# Patient Record
Sex: Female | Born: 1935 | Race: White | Hispanic: No | Smoking: Current every day smoker
Health system: Southern US, Community
[De-identification: ages and names within clinical notes are randomized; demographics above are authoritative.]

## PROBLEM LIST (undated history)

## (undated) DIAGNOSIS — M8580 Other specified disorders of bone density and structure, unspecified site: Secondary | ICD-10-CM

## (undated) DIAGNOSIS — I10 Essential (primary) hypertension: Secondary | ICD-10-CM

## (undated) DIAGNOSIS — I779 Disorder of arteries and arterioles, unspecified: Secondary | ICD-10-CM

## (undated) DIAGNOSIS — E785 Hyperlipidemia, unspecified: Secondary | ICD-10-CM

## (undated) DIAGNOSIS — C801 Malignant (primary) neoplasm, unspecified: Secondary | ICD-10-CM

## (undated) DIAGNOSIS — I739 Peripheral vascular disease, unspecified: Secondary | ICD-10-CM

---

## 2004-10-17 ENCOUNTER — Ambulatory Visit: Payer: Self-pay | Admitting: Family Medicine

## 2004-11-03 ENCOUNTER — Ambulatory Visit: Payer: Self-pay | Admitting: Unknown Physician Specialty

## 2005-05-16 ENCOUNTER — Ambulatory Visit: Payer: Self-pay | Admitting: Unknown Physician Specialty

## 2005-11-27 ENCOUNTER — Ambulatory Visit: Payer: Self-pay | Admitting: Family Medicine

## 2007-03-27 ENCOUNTER — Ambulatory Visit: Payer: Self-pay | Admitting: Family Medicine

## 2007-04-01 ENCOUNTER — Ambulatory Visit: Payer: Self-pay | Admitting: Family Medicine

## 2007-08-26 ENCOUNTER — Ambulatory Visit: Payer: Self-pay | Admitting: Family Medicine

## 2008-04-05 ENCOUNTER — Ambulatory Visit: Payer: Self-pay | Admitting: Family Medicine

## 2009-05-19 ENCOUNTER — Ambulatory Visit: Payer: Self-pay | Admitting: Family Medicine

## 2009-05-31 ENCOUNTER — Ambulatory Visit: Payer: Self-pay | Admitting: Family Medicine

## 2009-09-21 ENCOUNTER — Emergency Department: Payer: Self-pay | Admitting: Emergency Medicine

## 2009-09-28 ENCOUNTER — Ambulatory Visit: Payer: Self-pay | Admitting: Orthopedic Surgery

## 2009-09-29 ENCOUNTER — Ambulatory Visit: Payer: Self-pay | Admitting: Orthopedic Surgery

## 2009-11-29 ENCOUNTER — Ambulatory Visit: Payer: Self-pay | Admitting: Family Medicine

## 2010-07-19 ENCOUNTER — Ambulatory Visit: Payer: Self-pay | Admitting: Family Medicine

## 2010-11-01 ENCOUNTER — Ambulatory Visit: Payer: Self-pay | Admitting: Anesthesiology

## 2010-12-01 ENCOUNTER — Ambulatory Visit: Payer: Self-pay | Admitting: Anesthesiology

## 2010-12-04 ENCOUNTER — Ambulatory Visit: Payer: Self-pay | Admitting: Family Medicine

## 2011-01-30 ENCOUNTER — Ambulatory Visit: Payer: Self-pay | Admitting: Anesthesiology

## 2011-04-17 ENCOUNTER — Ambulatory Visit: Payer: Self-pay | Admitting: Anesthesiology

## 2011-05-28 ENCOUNTER — Ambulatory Visit: Payer: Self-pay | Admitting: Anesthesiology

## 2011-06-21 ENCOUNTER — Ambulatory Visit: Payer: Self-pay | Admitting: Pain Medicine

## 2011-06-28 ENCOUNTER — Ambulatory Visit: Payer: Self-pay | Admitting: Pain Medicine

## 2011-07-16 ENCOUNTER — Ambulatory Visit: Payer: Self-pay | Admitting: Pain Medicine

## 2011-12-31 ENCOUNTER — Ambulatory Visit: Payer: Self-pay | Admitting: Family Medicine

## 2012-01-31 ENCOUNTER — Ambulatory Visit: Payer: Self-pay | Admitting: Pain Medicine

## 2012-02-19 ENCOUNTER — Ambulatory Visit: Payer: Self-pay | Admitting: Pain Medicine

## 2012-12-31 ENCOUNTER — Ambulatory Visit: Payer: Self-pay | Admitting: Family Medicine

## 2014-01-01 ENCOUNTER — Ambulatory Visit: Payer: Self-pay | Admitting: Family Medicine

## 2014-01-16 ENCOUNTER — Emergency Department: Payer: Self-pay | Admitting: Emergency Medicine

## 2014-01-16 LAB — COMPREHENSIVE METABOLIC PANEL
ALBUMIN: 3.2 g/dL — AB (ref 3.4–5.0)
ANION GAP: 6 — AB (ref 7–16)
Alkaline Phosphatase: 52 U/L
BILIRUBIN TOTAL: 0.3 mg/dL (ref 0.2–1.0)
BUN: 21 mg/dL — ABNORMAL HIGH (ref 7–18)
CREATININE: 1.04 mg/dL (ref 0.60–1.30)
Calcium, Total: 8.5 mg/dL (ref 8.5–10.1)
Chloride: 104 mmol/L (ref 98–107)
Co2: 27 mmol/L (ref 21–32)
EGFR (African American): 57 — ABNORMAL LOW
EGFR (Non-African Amer.): 49 — ABNORMAL LOW
Glucose: 89 mg/dL (ref 65–99)
OSMOLALITY: 276 (ref 275–301)
Potassium: 4.4 mmol/L (ref 3.5–5.1)
SGOT(AST): 35 U/L (ref 15–37)
SGPT (ALT): 15 U/L (ref 12–78)
Sodium: 137 mmol/L (ref 136–145)
TOTAL PROTEIN: 6.5 g/dL (ref 6.4–8.2)

## 2014-01-16 LAB — URINALYSIS, COMPLETE
Bacteria: NONE SEEN
Bilirubin,UR: NEGATIVE
Blood: NEGATIVE
Glucose,UR: NEGATIVE mg/dL (ref 0–75)
Hyaline Cast: 12
Ketone: NEGATIVE
Nitrite: NEGATIVE
PH: 5 (ref 4.5–8.0)
Protein: NEGATIVE
RBC,UR: 2 /HPF (ref 0–5)
SPECIFIC GRAVITY: 1.02 (ref 1.003–1.030)
Squamous Epithelial: 2
WBC UR: 17 /HPF (ref 0–5)

## 2014-01-16 LAB — CBC
HCT: 36.1 % (ref 35.0–47.0)
HGB: 12.1 g/dL (ref 12.0–16.0)
MCH: 32.5 pg (ref 26.0–34.0)
MCHC: 33.6 g/dL (ref 32.0–36.0)
MCV: 97 fL (ref 80–100)
Platelet: 252 10*3/uL (ref 150–440)
RBC: 3.72 10*6/uL — AB (ref 3.80–5.20)
RDW: 15.1 % — AB (ref 11.5–14.5)
WBC: 7.7 10*3/uL (ref 3.6–11.0)

## 2014-01-16 LAB — TROPONIN I
Troponin-I: <0.02 ng/mL
Troponin-I: <0.02 ng/mL

## 2015-01-03 ENCOUNTER — Ambulatory Visit
Admit: 2015-01-03 | Disposition: A | Discharge status: Home / Self Care | Payer: Self-pay | Attending: Family Medicine | Admitting: Family Medicine

## 2015-01-03 ENCOUNTER — Ambulatory Visit
Admit: 2015-01-03 | Disposition: A | Discharge status: Home / Self Care | Payer: Self-pay | Attending: Vascular Surgery | Admitting: Vascular Surgery

## 2015-03-09 ENCOUNTER — Encounter: Payer: Self-pay | Admitting: *Deleted

## 2015-03-09 ENCOUNTER — Inpatient Hospital Stay
Admission: RE | Admit: 2015-03-09 | Discharge: 2015-03-10 | DRG: 036 | Disposition: A | Discharge status: Home / Self Care | Payer: Medicare Other | Source: Ambulatory Visit | Attending: Vascular Surgery | Admitting: Vascular Surgery

## 2015-03-09 ENCOUNTER — Encounter
Admission: RE | Disposition: A | Discharge status: Home / Self Care | Payer: Self-pay | Source: Ambulatory Visit | Attending: Vascular Surgery

## 2015-03-09 DIAGNOSIS — I25119 Atherosclerotic heart disease of native coronary artery with unspecified angina pectoris: Secondary | ICD-10-CM | POA: Diagnosis present

## 2015-03-09 DIAGNOSIS — Z9842 Cataract extraction status, left eye: Secondary | ICD-10-CM

## 2015-03-09 DIAGNOSIS — F17200 Nicotine dependence, unspecified, uncomplicated: Secondary | ICD-10-CM | POA: Diagnosis present

## 2015-03-09 DIAGNOSIS — Z823 Family history of stroke: Secondary | ICD-10-CM

## 2015-03-09 DIAGNOSIS — Z8249 Family history of ischemic heart disease and other diseases of the circulatory system: Secondary | ICD-10-CM

## 2015-03-09 DIAGNOSIS — Z8542 Personal history of malignant neoplasm of other parts of uterus: Secondary | ICD-10-CM

## 2015-03-09 DIAGNOSIS — Z9841 Cataract extraction status, right eye: Secondary | ICD-10-CM | POA: Diagnosis not present

## 2015-03-09 DIAGNOSIS — M8580 Other specified disorders of bone density and structure, unspecified site: Secondary | ICD-10-CM | POA: Diagnosis present

## 2015-03-09 DIAGNOSIS — Z7982 Long term (current) use of aspirin: Secondary | ICD-10-CM | POA: Diagnosis not present

## 2015-03-09 DIAGNOSIS — I10 Essential (primary) hypertension: Secondary | ICD-10-CM | POA: Diagnosis present

## 2015-03-09 DIAGNOSIS — I6522 Occlusion and stenosis of left carotid artery: Secondary | ICD-10-CM | POA: Diagnosis present

## 2015-03-09 DIAGNOSIS — I6529 Occlusion and stenosis of unspecified carotid artery: Secondary | ICD-10-CM | POA: Diagnosis present

## 2015-03-09 DIAGNOSIS — Z9889 Other specified postprocedural states: Secondary | ICD-10-CM

## 2015-03-09 DIAGNOSIS — E785 Hyperlipidemia, unspecified: Secondary | ICD-10-CM | POA: Diagnosis present

## 2015-03-09 DIAGNOSIS — I739 Peripheral vascular disease, unspecified: Secondary | ICD-10-CM | POA: Diagnosis present

## 2015-03-09 DIAGNOSIS — Z8673 Personal history of transient ischemic attack (TIA), and cerebral infarction without residual deficits: Secondary | ICD-10-CM

## 2015-03-09 HISTORY — PX: PERIPHERAL VASCULAR CATHETERIZATION: SHX172C

## 2015-03-09 HISTORY — DX: Malignant (primary) neoplasm, unspecified: C80.1

## 2015-03-09 HISTORY — DX: Essential (primary) hypertension: I10

## 2015-03-09 HISTORY — DX: Peripheral vascular disease, unspecified: I73.9

## 2015-03-09 HISTORY — DX: Other specified disorders of bone density and structure, unspecified site: M85.80

## 2015-03-09 HISTORY — DX: Disorder of arteries and arterioles, unspecified: I77.9

## 2015-03-09 HISTORY — DX: Hyperlipidemia, unspecified: E78.5

## 2015-03-09 LAB — CREATININE, SERUM
Creatinine, Ser: 1.03 mg/dL — ABNORMAL HIGH (ref 0.44–1.00)
GFR calc non Af Amer: 48 mL/min — ABNORMAL LOW (ref >60)
GFR, EST AFRICAN AMERICAN: 55 mL/min — AB (ref >60)

## 2015-03-09 LAB — BUN: BUN: 14 mg/dL (ref 6–20)

## 2015-03-09 SURGERY — CAROTID PTA/STENT INTERVENTION
Anesthesia: Moderate Sedation | Laterality: Left

## 2015-03-09 MED ORDER — MORPHINE SULFATE 4 MG/ML IJ SOLN: 2.0000 mg | INTRAMUSCULAR | Status: DC | PRN

## 2015-03-09 MED ORDER — GUAIFENESIN-DM 100-10 MG/5ML PO SYRP: 15.0000 mL | ORAL_SOLUTION | ORAL | Status: DC | PRN

## 2015-03-09 MED ORDER — MIDAZOLAM HCL 5 MG/5ML IJ SOLN
INTRAMUSCULAR | Status: AC
  Filled 2015-03-09: qty 5

## 2015-03-09 MED ORDER — ACETAMINOPHEN 325 MG PO TABS
325.0000 mg | ORAL_TABLET | ORAL | Status: DC | PRN
Start: 2015-03-09 — End: 2015-03-10

## 2015-03-09 MED ORDER — MIDAZOLAM HCL 2 MG/2ML IJ SOLN
INTRAMUSCULAR | Status: DC | PRN
  Administered 2015-03-09: 2 mg via INTRAVENOUS
  Administered 2015-03-09 (×2): 0.5 mg via INTRAVENOUS
  Administered 2015-03-09: 1 mg via INTRAVENOUS

## 2015-03-09 MED ORDER — PHENYLEPHRINE HCL 10 MG/ML IJ SOLN
INTRAMUSCULAR | Status: AC
  Filled 2015-03-09: qty 1

## 2015-03-09 MED ORDER — ACETAMINOPHEN 325 MG RE SUPP
325.0000 mg | RECTAL | Status: DC | PRN
Start: 2015-03-09 — End: 2015-03-10

## 2015-03-09 MED ORDER — SODIUM CHLORIDE 0.9 % IV SOLN
INTRAVENOUS | Status: DC
  Administered 2015-03-09: 10:00:00 via INTRAVENOUS

## 2015-03-09 MED ORDER — POTASSIUM CHLORIDE CRYS ER 20 MEQ PO TBCR
20.0000 meq | EXTENDED_RELEASE_TABLET | Freq: Every day | ORAL | Status: DC | PRN
Start: 2015-03-09 — End: 2015-03-10

## 2015-03-09 MED ORDER — FAMOTIDINE IN NACL 20-0.9 MG/50ML-% IV SOLN
20.0000 mg | Freq: Two times a day (BID) | INTRAVENOUS | Status: DC
  Administered 2015-03-09 (×2): 20 mg via INTRAVENOUS
  Filled 2015-03-09 (×5): qty 50

## 2015-03-09 MED ORDER — LIDOCAINE HCL (PF) 1 % IJ SOLN
INTRAMUSCULAR | Status: AC
  Filled 2015-03-09: qty 10

## 2015-03-09 MED ORDER — HEPARIN SODIUM (PORCINE) 1000 UNIT/ML IJ SOLN
INTRAMUSCULAR | Status: AC
  Filled 2015-03-09: qty 1

## 2015-03-09 MED ORDER — ONDANSETRON HCL 4 MG/2ML IJ SOLN: 4.0000 mg | Freq: Four times a day (QID) | INTRAMUSCULAR | Status: DC | PRN

## 2015-03-09 MED ORDER — DOPAMINE-DEXTROSE 3.2-5 MG/ML-% IV SOLN: 3.0000 ug/kg/min | INTRAVENOUS | Status: DC

## 2015-03-09 MED ORDER — BACLOFEN 10 MG PO TABS
10.0000 mg | ORAL_TABLET | Freq: Three times a day (TID) | ORAL | Status: DC
  Administered 2015-03-09 – 2015-03-10 (×2): 10 mg via ORAL
  Filled 2015-03-09 (×2): qty 1

## 2015-03-09 MED ORDER — DEXTROSE 5 % IV SOLN
1.5000 g | Freq: Two times a day (BID) | INTRAVENOUS | Status: AC
  Administered 2015-03-09 – 2015-03-10 (×2): 1.5 g via INTRAVENOUS
  Filled 2015-03-09 (×2): qty 1.5

## 2015-03-09 MED ORDER — LABETALOL HCL 5 MG/ML IV SOLN: 10.0000 mg | INTRAVENOUS | Status: DC | PRN

## 2015-03-09 MED ORDER — ASPIRIN EC 81 MG PO TBEC
81.0000 mg | DELAYED_RELEASE_TABLET | Freq: Every day | ORAL | Status: DC
  Administered 2015-03-09 – 2015-03-10 (×2): 81 mg via ORAL
  Filled 2015-03-09: qty 1

## 2015-03-09 MED ORDER — HEPARIN SODIUM (PORCINE) 1000 UNIT/ML IJ SOLN
INTRAMUSCULAR | Status: AC
Start: 2015-03-09 — End: 2015-03-09
  Filled 2015-03-09: qty 1

## 2015-03-09 MED ORDER — ASPIRIN 81 MG PO CHEW
CHEWABLE_TABLET | ORAL | Status: AC
  Filled 2015-03-09: qty 1

## 2015-03-09 MED ORDER — CEFAZOLIN SODIUM 1-5 GM-% IV SOLN
INTRAVENOUS | Status: AC
  Filled 2015-03-09: qty 50

## 2015-03-09 MED ORDER — FENTANYL CITRATE (PF) 100 MCG/2ML IJ SOLN
INTRAMUSCULAR | Status: AC
  Filled 2015-03-09: qty 2

## 2015-03-09 MED ORDER — PHENOL 1.4 % MT LIQD: 1.0000 | OROMUCOSAL | Status: DC | PRN

## 2015-03-09 MED ORDER — OXYCODONE-ACETAMINOPHEN 5-325 MG PO TABS: 1.0000 | ORAL_TABLET | ORAL | Status: DC | PRN

## 2015-03-09 MED ORDER — ASPIRIN EC 81 MG PO TBEC: 81.0000 mg | DELAYED_RELEASE_TABLET | Freq: Every day | ORAL | Status: DC

## 2015-03-09 MED ORDER — SODIUM CHLORIDE 0.9 % IV SOLN
INTRAVENOUS | Status: DC
  Administered 2015-03-09: 12:00:00 via INTRAVENOUS

## 2015-03-09 MED ORDER — MAGNESIUM SULFATE 2 GM/50ML IV SOLN: 2.0000 g | Freq: Every day | INTRAVENOUS | Status: DC | PRN

## 2015-03-09 MED ORDER — METOPROLOL TARTRATE 1 MG/ML IV SOLN
2.0000 mg | INTRAVENOUS | Status: DC | PRN
Start: 2015-03-09 — End: 2015-03-10

## 2015-03-09 MED ORDER — CLOPIDOGREL BISULFATE 75 MG PO TABS
75.0000 mg | ORAL_TABLET | Freq: Every day | ORAL | Status: DC
  Administered 2015-03-09 – 2015-03-10 (×2): 75 mg via ORAL
  Filled 2015-03-09: qty 1

## 2015-03-09 MED ORDER — ALUM & MAG HYDROXIDE-SIMETH 200-200-20 MG/5ML PO SUSP: 15.0000 mL | ORAL | Status: DC | PRN

## 2015-03-09 MED ORDER — ATORVASTATIN CALCIUM 10 MG PO TABS
10.0000 mg | ORAL_TABLET | Freq: Every day | ORAL | Status: DC
  Administered 2015-03-10: 10 mg via ORAL
  Filled 2015-03-09: qty 1

## 2015-03-09 MED ORDER — SODIUM CHLORIDE 0.9 % IV SOLN: 500.0000 mL | Freq: Once | INTRAVENOUS | Status: AC | PRN

## 2015-03-09 MED ORDER — ATROPINE SULFATE 0.1 MG/ML IJ SOLN
INTRAMUSCULAR | Status: AC
  Filled 2015-03-09: qty 10

## 2015-03-09 MED ORDER — CLOPIDOGREL BISULFATE 75 MG PO TABS
ORAL_TABLET | ORAL | Status: AC
  Filled 2015-03-09: qty 1

## 2015-03-09 MED ORDER — SODIUM CHLORIDE 0.9 % IJ SOLN
INTRAMUSCULAR | Status: AC
  Filled 2015-03-09: qty 15

## 2015-03-09 MED ORDER — CEFAZOLIN SODIUM 1-5 GM-% IV SOLN
1.0000 g | Freq: Once | INTRAVENOUS | Status: AC
  Administered 2015-03-09: 1 g via INTRAVENOUS

## 2015-03-09 MED ORDER — CALCIUM GLUCONATE 500 MG PO TABS
1.0000 | ORAL_TABLET | Freq: Three times a day (TID) | ORAL | Status: DC
  Administered 2015-03-10: 500 mg via ORAL
  Filled 2015-03-09: qty 1

## 2015-03-09 MED ORDER — DOCUSATE SODIUM 100 MG PO CAPS
100.0000 mg | ORAL_CAPSULE | Freq: Two times a day (BID) | ORAL | Status: DC
  Administered 2015-03-09: 100 mg via ORAL
  Filled 2015-03-09: qty 1

## 2015-03-09 MED ORDER — VITAMIN D3 25 MCG (1000 UNIT) PO TABS
5000.0000 [IU] | ORAL_TABLET | Freq: Every day | ORAL | Status: DC
  Administered 2015-03-10: 5000 [IU] via ORAL
  Filled 2015-03-09: qty 5

## 2015-03-09 MED ORDER — HEPARIN (PORCINE) IN NACL 2-0.9 UNIT/ML-% IJ SOLN
INTRAMUSCULAR | Status: AC
  Filled 2015-03-09: qty 1000

## 2015-03-09 MED ORDER — FENTANYL CITRATE (PF) 100 MCG/2ML IJ SOLN
INTRAMUSCULAR | Status: DC | PRN
  Administered 2015-03-09: 25 ug via INTRAVENOUS
  Administered 2015-03-09: 50 ug via INTRAVENOUS
  Administered 2015-03-09: 25 ug via INTRAVENOUS
  Administered 2015-03-09: 50 ug via INTRAVENOUS

## 2015-03-09 MED ORDER — HEPARIN SODIUM (PORCINE) 1000 UNIT/ML IJ SOLN
INTRAMUSCULAR | Status: DC | PRN
  Administered 2015-03-09: 5000 [IU] via INTRAVENOUS

## 2015-03-09 SURGICAL SUPPLY — 19 items
BALLN VIATRAC 5X20X135 (BALLOONS) ×4
BALLOON VIATRAC 5X20X135 (BALLOONS) ×2 IMPLANT
CARTRIDGE ACTIVE CLOT (MISCELLANEOUS) ×4 IMPLANT
CATH BEACON 5 .035 100 JB2 TIP (CATHETERS) ×4 IMPLANT
CATH PIG 5.0X110 10S (CATHETERS) ×4 IMPLANT
DEVICE EMBOSHIELD NAV6 4.0-7.0 (WIRE) ×4 IMPLANT
DEVICE STARCLOSE SE CLOSURE (Vascular Products) ×4 IMPLANT
GLIDEWIRE ANGLED SS 035X260CM (WIRE) ×4 IMPLANT
GUIDEWIRE ANGLED .035X260CM (WIRE) ×4 IMPLANT
KIT CAROTID MANIFOLD (MISCELLANEOUS) ×4 IMPLANT
PACK ANGIOGRAPHY (CUSTOM PROCEDURE TRAY) ×4 IMPLANT
SET INTRO CAPELLA COAXIAL (SET/KITS/TRAYS/PACK) ×4 IMPLANT
SHEATH BRITE TIP 5FRX11 (SHEATH) ×4 IMPLANT
SHEATH SHUTTLE SELECT 6F (SHEATH) ×4 IMPLANT
STENT XACT CAR 8X30X136 (Permanent Stent) ×4 IMPLANT
SYR MEDRAD MARK V 150ML (SYRINGE) ×4 IMPLANT
TUBING CONTRAST HIGH PRESS 72 (TUBING) ×4 IMPLANT
WIRE AMPLATZ SSTIFF .035X260CM (WIRE) ×4 IMPLANT
WIRE J 3MM .035X145CM (WIRE) ×4 IMPLANT

## 2015-03-09 NOTE — Progress Notes (Signed)
S:  Alert no c/o  O:  VSS neuro intact groin fine  A:  S/p left carotid stent placement  P:  Normal course thus far no changes

## 2015-03-09 NOTE — Progress Notes (Signed)
Pt doing well post carotid stent placement, bil. Grips strong,equal, alert and orientedx4, taking po's without difficulty, vitals have remained stable with no present need to start dopamine gtt, Dr Delana Meyer in to see pt. With questions answered to pt and family

## 2015-03-09 NOTE — Op Note (Signed)
OPERATIVE NOTE   PROCEDURE: 1.  ultrasound guidance for vascular access right femoral artery 2.  Placement of a 8 x 30 Exact stent with the use of the NAV-6 embolic protection device  PRE-OPERATIVE DIAGNOSIS: 1. Symptomatic carotid artery stenosis. 2. Recent stroke 3. Coronary artery disease with exertional angina  POST-OPERATIVE DIAGNOSIS:  Same as above  SURGEON: Hortencia Pilar, M.D.  ASSISTANT(S):  Leotis Pain, MD  ANESTHESIA: local/MCS  ESTIMATED BLOOD LOSS:  100 cc  FINDING(S): 1.   90% left internal carotid artery stenosis  SPECIMEN(S):   none  INDICATIONS:   Patient is a 81 year old woman who presents with  symptomatic left internal carotid artery stenosis.  During her cardiac evaluation she had ischemic changes however cardiology felt she was high risk for coronary intervention and not a candidate for surgery and therefore recommended proceeding with treatment of her carotid lesion using stenting rather than surgery  DESCRIPTION: After obtaining full informed written consent, the patient was brought back to the operating room and placed supine upon the operating table.  The patient received IV antibiotics prior to induction.  After obtaining adequate anesthesia, the patient was prepped and draped in the standard fashion for.   The right femoral artery was visualized with ultrasound and found to be widely patent. It was then accessed under direct ultrasound guidance without difficulty with a Seldinger needle. A permanent image was recorded. A J-wire was placed and we then placed a 6 French sheath. The patient was then heparinized and a total of 5000 units of intravenous heparin were given. A pigtail catheter was then placed into the ascending aorta. This showed a type III arch. I then selectively cannulated the left common carotid without difficulty with a JB 1 catheter and advanced into the mid portion of the common carotid artery.  Cervical and cerebral carotid angiography  was then performed. There were no obvious intracranial filling defects with hand injection of contrast. Of note on the water's view there was very poor filling of the anterior cerebral artery The carotid bifurcation demonstrated severe lesion proximally 1 cm distal to the internal carotid artery origin measuring approximately 90% diameter reduction.  I then advanced into the external carotid artery with a Glidewire and the JB 1 catheter and then exchanged for the Amplatz Super Stiff wire. Over the Amplatz Super Stiff wire, a 6 [place] shuttle sheath was placed into the mid common carotid artery. I then used the large NAV-6  Embolic protection device and crossed the lesion and parked this in the distal internal carotid artery at the base of the skull. Attempts at advancing the stent cause the sheath to retract to the mid-level of the common carotid and therefore the stent was removed and the 5 x 2 balloon was advanced over the wire positioned across the lesion. Predilatation was performed to 6 atm for a brief time period. The sheath was then advanced just short of the lesion and the stent reintroduced without difficulty. I then selected an 8 x 30 straight Exact stent. This was deployed across the lesion encompassing it in its entirety. A 5 x 2 balloon was used to post dilate the stent. Only about a 20 % residual stenosis was present after angioplasty. Completion angiogram showed normal intracranial filling without new defects and a dramatic improvement in anterior cerebral perfusion. At this point I elected to terminate the procedure. The sheath was removed and StarClose closure device was deployed in the left femoral artery with excellent hemostatic result. The patient  was taken to the recovery room in stable condition having tolerated the procedure well.  COMPLICATIONS: none  CONDITION: stable  [name] 03/09/2015 11:41 AM

## 2015-03-10 ENCOUNTER — Encounter: Payer: Self-pay | Admitting: Vascular Surgery

## 2015-03-10 LAB — CBC
HCT: 34.8 % — ABNORMAL LOW (ref 35.0–47.0)
Hemoglobin: 11.7 g/dL — ABNORMAL LOW (ref 12.0–16.0)
MCH: 32.4 pg (ref 26.0–34.0)
MCHC: 33.7 g/dL (ref 32.0–36.0)
MCV: 96 fL (ref 80.0–100.0)
PLATELETS: 158 10*3/uL (ref 150–440)
RBC: 3.62 MIL/uL — AB (ref 3.80–5.20)
RDW: 14.6 % — AB (ref 11.5–14.5)
WBC: 6.2 10*3/uL (ref 3.6–11.0)

## 2015-03-10 LAB — BASIC METABOLIC PANEL
Anion gap: 4 — ABNORMAL LOW (ref 5–15)
BUN: 9 mg/dL (ref 6–20)
CO2: 27 mmol/L (ref 22–32)
Calcium: 8.1 mg/dL — ABNORMAL LOW (ref 8.9–10.3)
Chloride: 111 mmol/L (ref 101–111)
Creatinine, Ser: 0.94 mg/dL (ref 0.44–1.00)
GFR calc Af Amer: >60 mL/min (ref >60)
GFR calc non Af Amer: 53 mL/min — ABNORMAL LOW (ref >60)
Glucose, Bld: 91 mg/dL (ref 65–99)
Potassium: 4.1 mmol/L (ref 3.5–5.1)
Sodium: 142 mmol/L (ref 135–145)

## 2015-03-10 MED ORDER — CLOPIDOGREL BISULFATE 75 MG PO TABS: 75.0000 mg | ORAL_TABLET | Freq: Every day | ORAL | Status: DC

## 2015-03-10 NOTE — Progress Notes (Signed)
IVs removed, cathether intact.  Patient's belongings, discharge instructions, and prescription given to patient.  Patient and family with no further questions.   Patient discharged via wheel chair.

## 2015-03-10 NOTE — Progress Notes (Signed)
Patient doing well with no complaints.  Right groin site wdl, pulses at baseline.  MD in room to see patient this am.  Sitting in bed eating breakfast.  Patient waiting on discharge order and paperwork.

## 2015-03-11 NOTE — H&P (Signed)
 [facility] History & Physical Update  The patient was interviewed and re-examined.  The patient's previous History and Physical has been reviewed and is unchanged.  There is no change in the plan of care. We plan to proceed with the scheduled procedure.  Zac Torti, Dolores Lory, MD  03/11/2015, 9:11 AM

## 2015-03-15 ENCOUNTER — Encounter: Payer: Self-pay | Admitting: Vascular Surgery

## 2015-03-23 NOTE — Discharge Summary (Signed)
[facility]    Discharge Summary    Patient ID:  Jamie Spence MRN: [id number] DOB/AGE: Jun 18, 1926 81 y.o.  Admit date: 03/09/2015 Discharge date: 03/23/2015 Date of Surgery: 03/09/2015 Surgeon: Surgeon(s): [name], MD  Admission Diagnosis: Carotid Stenosis    ABBOTT REP NEEDED  Discharge Diagnoses:  Carotid Stenosis    ABBOTT REP NEEDED  Secondary Diagnoses: Past Medical History  Diagnosis Date  . Hypertension   . Peripheral vascular disease   . Carotid artery disease   . Osteopenia   . Hyperlipemia   . Cancer     uterine    Procedure(s): Carotid PTA/Stent Intervention Carotid Angiography  Discharged Condition: good  HPI:  Patient with critical stenosis and multiple comorbidities and therefore is undergoing carotid stenting  Hospital Course:  Jamie Spence is a 81 y.o. female is S/P Left Procedure(s): Carotid PTA/Stent Intervention Carotid Angiography Extubated: POD # 0 Physical exam: Patient remains neurologically intact no hematoma of the groin Post-op wounds clean, dry, intact or healing well Pt. Ambulating, voiding and taking PO diet without difficulty. Pt pain controlled with PO pain meds. Labs as below Complications:none  Consults:     Significant Diagnostic Studies: CBC Lab Results  Component Value Date   WBC 6.2 03/10/2015   HGB 11.7* 03/10/2015   HCT 34.8* 03/10/2015   MCV 96.0 03/10/2015   PLT 158 03/10/2015    BMET    Component Value Date/Time   NA 142 03/10/2015 0558   NA 137 01/16/2014 1704   K 4.1 03/10/2015 0558   K 4.4 01/16/2014 1704   CL 111 03/10/2015 0558   CL 104 01/16/2014 1704   CO2 27 03/10/2015 0558   CO2 27 01/16/2014 1704   GLUCOSE 91 03/10/2015 0558   GLUCOSE 89 01/16/2014 1704   BUN 9 03/10/2015 0558   BUN 21* 01/16/2014 1704   CREATININE 0.94 03/10/2015 0558   CREATININE 1.04 01/16/2014 1704   CALCIUM 8.1* 03/10/2015 0558   CALCIUM 8.5 01/16/2014 1704    GFRNONAA 53* 03/10/2015 0558   GFRNONAA 49* 01/16/2014 1704   GFRAA >60 03/10/2015 0558   GFRAA 57* 01/16/2014 1704   COAG No results found for: INR, PROTIME   Disposition:  Discharge to :Home Discharge Instructions    Call MD for:  redness, tenderness, or signs of infection (pain, swelling, bleeding, redness, odor or green/yellow discharge around incision site)    Complete by:  As directed      Call MD for:  severe or increased pain, loss or decreased feeling  in affected limb(s)    Complete by:  As directed      Call MD for:  temperature >100.5    Complete by:  As directed      Resume previous diet    Complete by:  As directed             Medication List    TAKE these medications        aspirin EC 81 MG tablet  Take 81 mg by mouth daily.     atorvastatin 10 MG tablet  Commonly known as:  LIPITOR  Take 10 mg by mouth daily.     baclofen 10 MG tablet  Commonly known as:  LIORESAL  Take 10 mg by mouth 3 (three) times daily.     calcium gluconate 500 MG tablet  Take 1 tablet by mouth 3 (three) times daily.     cholecalciferol 1000 UNITS tablet  Commonly known as:  VITAMIN D  Take 5,000 Units by mouth daily.     clopidogrel 75 MG tablet  Commonly known as:  PLAVIX  Take 1 tablet (75 mg total) by mouth daily.     docusate sodium 100 MG capsule  Commonly known as:  COLACE  Take 100 mg by mouth 2 (two) times daily.     ferrous sulfate 75 (15 FE) MG/ML Soln  Commonly known as:  FER-IN-SOL  Take 65 mg by mouth.     magnesium oxide 400 MG tablet  Commonly known as:  MAG-OX  Take 400 mg by mouth daily.       Verbal and written Discharge instructions given to the patient. Wound care per Discharge AVS     Follow-up Information    Follow up with Paxton Binns, Dolores Lory, MD In 2 weeks.   Specialties:  Vascular Surgery, Cardiology, Radiology, Vascular Surgery   Why:  follow up after procedure   Contact information:   [place]  [id number] [phone]       Signed: [name], MD  03/23/2015, 10:26 AM

## 2015-05-22 ENCOUNTER — Emergency Department: Payer: Medicare Other

## 2015-05-22 ENCOUNTER — Emergency Department
Admission: EM | Admit: 2015-05-22 | Discharge: 2015-05-22 | Disposition: A | Discharge status: Home / Self Care | Payer: Medicare Other | Attending: Emergency Medicine | Admitting: Emergency Medicine

## 2015-05-22 ENCOUNTER — Encounter: Payer: Self-pay | Admitting: Adult Health

## 2015-05-22 DIAGNOSIS — Z7982 Long term (current) use of aspirin: Secondary | ICD-10-CM | POA: Insufficient documentation

## 2015-05-22 DIAGNOSIS — Z23 Encounter for immunization: Secondary | ICD-10-CM | POA: Diagnosis not present

## 2015-05-22 DIAGNOSIS — Z7902 Long term (current) use of antithrombotics/antiplatelets: Secondary | ICD-10-CM | POA: Diagnosis not present

## 2015-05-22 DIAGNOSIS — N390 Urinary tract infection, site not specified: Secondary | ICD-10-CM

## 2015-05-22 DIAGNOSIS — Z720 Tobacco use: Secondary | ICD-10-CM | POA: Insufficient documentation

## 2015-05-22 DIAGNOSIS — Z79899 Other long term (current) drug therapy: Secondary | ICD-10-CM | POA: Insufficient documentation

## 2015-05-22 DIAGNOSIS — I10 Essential (primary) hypertension: Secondary | ICD-10-CM | POA: Diagnosis not present

## 2015-05-22 DIAGNOSIS — R55 Syncope and collapse: Secondary | ICD-10-CM | POA: Diagnosis not present

## 2015-05-22 LAB — CBC
HEMATOCRIT: 39.6 % (ref 35.0–47.0)
HEMOGLOBIN: 13.4 g/dL (ref 12.0–16.0)
MCH: 33.5 pg (ref 26.0–34.0)
MCHC: 33.8 g/dL (ref 32.0–36.0)
MCV: 99.1 fL (ref 80.0–100.0)
Platelets: 215 10*3/uL (ref 150–440)
RBC: 4 MIL/uL (ref 3.80–5.20)
RDW: 14.2 % (ref 11.5–14.5)
WBC: 8.8 10*3/uL (ref 3.6–11.0)

## 2015-05-22 LAB — URINALYSIS COMPLETE WITH MICROSCOPIC (ARMC ONLY)
Bilirubin Urine: NEGATIVE
Glucose, UA: NEGATIVE mg/dL
Hgb urine dipstick: NEGATIVE
Ketones, ur: NEGATIVE mg/dL
Nitrite: NEGATIVE
PH: 7 (ref 5.0–8.0)
PROTEIN: NEGATIVE mg/dL
Specific Gravity, Urine: 1.008 (ref 1.005–1.030)

## 2015-05-22 LAB — TROPONIN I

## 2015-05-22 LAB — BASIC METABOLIC PANEL
Anion gap: 7 (ref 5–15)
BUN: 14 mg/dL (ref 6–20)
CHLORIDE: 102 mmol/L (ref 101–111)
CO2: 27 mmol/L (ref 22–32)
Calcium: 9.2 mg/dL (ref 8.9–10.3)
Creatinine, Ser: 1.11 mg/dL — ABNORMAL HIGH (ref 0.44–1.00)
GFR calc non Af Amer: 43 mL/min — ABNORMAL LOW (ref >60)
GFR, EST AFRICAN AMERICAN: 50 mL/min — AB (ref >60)
Glucose, Bld: 95 mg/dL (ref 65–99)
POTASSIUM: 4.3 mmol/L (ref 3.5–5.1)
SODIUM: 136 mmol/L (ref 135–145)

## 2015-05-22 MED ORDER — OXYCODONE-ACETAMINOPHEN 5-325 MG PO TABS
1.0000 | ORAL_TABLET | Freq: Once | ORAL | Status: AC
  Administered 2015-05-22: 1 via ORAL
  Filled 2015-05-22: qty 1

## 2015-05-22 MED ORDER — CEPHALEXIN 500 MG PO CAPS: 500.0000 mg | ORAL_CAPSULE | Freq: Three times a day (TID) | ORAL | Status: DC

## 2015-05-22 MED ORDER — TETANUS-DIPHTH-ACELL PERTUSSIS 5-2.5-18.5 LF-MCG/0.5 IM SUSP
0.5000 mL | Freq: Once | INTRAMUSCULAR | Status: AC
  Administered 2015-05-22: 0.5 mL via INTRAMUSCULAR
  Filled 2015-05-22: qty 0.5

## 2015-05-22 NOTE — Discharge Instructions (Signed)
Please follow up with your primary care doctor in the next 2-3 days. Please seek medical attention for any high fevers, chest pain, shortness of breath, change in behavior, persistent vomiting, bloody stool or any other new or concerning symptoms.   Syncope Syncope is a medical term for fainting or passing out. This means you lose consciousness and drop to the ground. People are generally unconscious for less than 5 minutes. You may have some muscle twitches for up to 15 seconds before waking up and returning to normal. Syncope occurs more often in older adults, but it can happen to anyone. While most causes of syncope are not dangerous, syncope can be a sign of a serious medical problem. It is important to seek medical care.  CAUSES  Syncope is caused by a sudden drop in blood flow to the brain. The specific cause is often not determined. Factors that can bring on syncope include:  Taking medicines that lower blood pressure.  Sudden changes in posture, such as standing up quickly.  Taking more medicine than prescribed.  Standing in one place for too long.  Seizure disorders.  Dehydration and excessive exposure to heat.  Low blood sugar (hypoglycemia).  Straining to have a bowel movement.  Heart disease, irregular heartbeat, or other circulatory problems.  Fear, emotional distress, seeing blood, or severe pain. SYMPTOMS  Right before fainting, you may:  Feel dizzy or light-headed.  Feel nauseous.  See all white or all black in your field of vision.  Have cold, clammy skin. DIAGNOSIS  Your health care provider will ask about your symptoms, perform a physical exam, and perform an electrocardiogram (ECG) to record the electrical activity of your heart. Your health care provider may also perform other heart or blood tests to determine the cause of your syncope which may include:  Transthoracic echocardiogram (TTE). During echocardiography, sound waves are used to evaluate how  blood flows through your heart.  Transesophageal echocardiogram (TEE).  Cardiac monitoring. This allows your health care provider to monitor your heart rate and rhythm in real time.  Holter monitor. This is a portable device that records your heartbeat and can help diagnose heart arrhythmias. It allows your health care provider to track your heart activity for several days, if needed.  Stress tests by exercise or by giving medicine that makes the heart beat faster. TREATMENT  In most cases, no treatment is needed. Depending on the cause of your syncope, your health care provider may recommend changing or stopping some of your medicines. HOME CARE INSTRUCTIONS  Have someone stay with you until you feel stable.  Do not drive, use machinery, or play sports until your health care provider says it is okay.  Keep all follow-up appointments as directed by your health care provider.  Lie down right away if you start feeling like you might faint. Breathe deeply and steadily. Wait until all the symptoms have passed.  Drink enough fluids to keep your urine clear or pale yellow.  If you are taking blood pressure or heart medicine, get up slowly and take several minutes to sit and then stand. This can reduce dizziness. SEEK IMMEDIATE MEDICAL CARE IF:   You have a severe headache.  You have unusual pain in the chest, abdomen, or back.  You are bleeding from your mouth or rectum, or you have black or tarry stool.  You have an irregular or very fast heartbeat.  You have pain with breathing.  You have repeated fainting or seizure-like jerking during an  episode.  You faint when sitting or lying down.  You have confusion.  You have trouble walking.  You have severe weakness.  You have vision problems. If you fainted, call your local emergency services (911 in [place] Do not drive yourself to the hospital.  MAKE SURE YOU:  Understand these instructions.  Will watch your  condition.  Will get help right away if you are not doing well or get worse. Document Released: 08/27/2005 Document Revised: 09/01/2013 Document Reviewed: 10/26/2011 [facility] Patient Information 2015 [place], [place]. This information is not intended to replace advice given to you by your health care provider. Make sure you discuss any questions you have with your health care provider.  Urinary Tract Infection Urinary tract infections (UTIs) can develop anywhere along your urinary tract. Your urinary tract is your body's drainage system for removing wastes and extra water. Your urinary tract includes two kidneys, two ureters, a bladder, and a urethra. Your kidneys are a pair of bean-shaped organs. Each kidney is about the size of your fist. They are located below your ribs, one on each side of your spine. CAUSES Infections are caused by microbes, which are microscopic organisms, including fungi, viruses, and bacteria. These organisms are so small that they can only be seen through a microscope. Bacteria are the microbes that most commonly cause UTIs. SYMPTOMS  Symptoms of UTIs may vary by age and gender of the patient and by the location of the infection. Symptoms in young women typically include a frequent and intense urge to urinate and a painful, burning feeling in the bladder or urethra during urination. Older women and men are more likely to be tired, shaky, and weak and have muscle aches and abdominal pain. A fever may mean the infection is in your kidneys. Other symptoms of a kidney infection include pain in your back or sides below the ribs, nausea, and vomiting. DIAGNOSIS To diagnose a UTI, your caregiver will ask you about your symptoms. Your caregiver also will ask to provide a urine sample. The urine sample will be tested for bacteria and white blood cells. White blood cells are made by your body to help fight infection. TREATMENT  Typically, UTIs can be treated with medication. Because  most UTIs are caused by a bacterial infection, they usually can be treated with the use of antibiotics. The choice of antibiotic and length of treatment depend on your symptoms and the type of bacteria causing your infection. HOME CARE INSTRUCTIONS  If you were prescribed antibiotics, take them exactly as your caregiver instructs you. Finish the medication even if you feel better after you have only taken some of the medication.  Drink enough water and fluids to keep your urine clear or pale yellow.  Avoid caffeine, tea, and carbonated beverages. They tend to irritate your bladder.  Empty your bladder often. Avoid holding urine for long periods of time.  Empty your bladder before and after sexual intercourse.  After a bowel movement, women should cleanse from front to back. Use each tissue only once. SEEK MEDICAL CARE IF:   You have back pain.  You develop a fever.  Your symptoms do not begin to resolve within 3 days. SEEK IMMEDIATE MEDICAL CARE IF:   You have severe back pain or lower abdominal pain.  You develop chills.  You have nausea or vomiting.  You have continued burning or discomfort with urination. MAKE SURE YOU:   Understand these instructions.  Will watch your condition.  Will get help right away  if you are not doing well or get worse. °Document Released: 06/06/2005 Document Revised: 02/26/2012 Document Reviewed: 10/05/2011 °[employer]® Patient Information ©2015 [employer]. This information is not intended to replace advice given to you by your health care provider. Make sure you discuss any questions you have with your health care provider. ° °

## 2015-05-22 NOTE — ED Notes (Signed)
Presents with syncopal episode while singeing at church-pt states, "[place] [employer] and started feeling dizzy and next thing I know I wake up with people all around" takes plavix, hit head on pew in front of her-has abraisions to cheek, swollen and bleeding nose, hematoma to left forehead, c/o headahce-denies chest pain and SOB. Did not eat breakfast this am but says that is nothing unusual-had a recent carotid stent placed in June. Alert and responding appropriately now. BP 146/66.

## 2015-05-22 NOTE — ED Provider Notes (Signed)
[facility] Emergency Department Provider Note   ____________________________________________  Time seen: 1150  I have reviewed the triage vital signs and the nursing notes.   HISTORY  Chief Complaint Loss of Consciousness   History limited by: Not Limited   HPI Jamie Spence is a 81 y.o. female who presents to the emergency department today after a syncopal episode and fall. The patient was at church standing up singing when she thought she went to sit down. However turns out she had passed out. She states she felt a little lightheaded prior to the episode although denied any chest pain, palpitations. She is on Plavix.    Past Medical History  Diagnosis Date  . Hypertension   . Peripheral vascular disease   . Carotid artery disease   . Osteopenia   . Hyperlipemia   . Cancer     uterine    Patient Active Problem List   Diagnosis Date Noted  . Carotid stenosis 03/09/2015    Past Surgical History  Procedure Laterality Date  . Peripheral vascular catheterization Left 03/09/2015    Procedure: Carotid PTA/Stent Intervention;  Surgeon: [name], MD;  Location: [place] [facility];  Service: Cardiovascular;  Laterality: Left;  . Peripheral vascular catheterization  03/09/2015    Procedure: Carotid Angiography;  Surgeon: [name], MD;  Location: [place] [facility];  Service: Cardiovascular;;    Current Outpatient Rx  Name  Route  Sig  Dispense  Refill  . aspirin EC 81 MG tablet   Oral   Take 81 mg by mouth daily.         Marland Kitchen atorvastatin (LIPITOR) 10 MG tablet   Oral   Take 10 mg by mouth daily.         . baclofen (LIORESAL) 10 MG tablet   Oral   Take 10 mg by mouth 3 (three) times daily.         . calcium gluconate 500 MG tablet   Oral   Take 1 tablet by mouth 3 (three) times daily.         . cholecalciferol (VITAMIN D) 1000 UNITS tablet   Oral   Take 5,000 Units by mouth daily.          . clopidogrel  (PLAVIX) 75 MG tablet   Oral   Take 1 tablet (75 mg total) by mouth daily.   30 tablet   5   . docusate sodium (COLACE) 100 MG capsule   Oral   Take 100 mg by mouth 2 (two) times daily.         . ferrous sulfate (FER-IN-SOL) 75 (15 FE) MG/ML SOLN   Oral   Take 65 mg by mouth.         . magnesium oxide (MAG-OX) 400 MG tablet   Oral   Take 400 mg by mouth daily.           Allergies Review of patient's allergies indicates no known allergies.  History reviewed. No pertinent family history.  Social History Social History  Substance Use Topics  . Smoking status: Current Every Day Smoker -- 1.00 packs/day for 60 years  . Smokeless tobacco: None  . Alcohol Use: No    Review of Systems  Constitutional: Negative for fever. Cardiovascular: Negative for chest pain. Respiratory: Negative for shortness of breath. Gastrointestinal: Negative for abdominal pain, vomiting and diarrhea. Genitourinary: Negative for dysuria. Musculoskeletal: Negative for back pain. Skin: Negative for rash. Neurological: Negative for headaches, focal  weakness or numbness.  10-point ROS otherwise negative.  ____________________________________________   PHYSICAL EXAM:  VITAL SIGNS: ED Triage Vitals  Enc Vitals Group     BP 05/22/15 1125 146/66 mmHg     Pulse Rate 05/22/15 1125 68     Resp 05/22/15 1125 18     Temp 05/22/15 1125 97.8 F (36.6 C)     Temp Source 05/22/15 1125 Oral     SpO2 05/22/15 1125 97 %     Weight 05/22/15 1125 126 lb (57.153 kg)     Height 05/22/15 1125 5\' 6"  (1.676 m)     Head Cir --      Peak Flow --      Pain Score 05/22/15 1126 6   Constitutional: Alert and oriented. Well appearing and in no distress. Eyes: Conjunctivae are normal. PERRL. Normal extraocular movements. ENT   Head: Normocephalic and atraumatic.   Nose: No congestion/rhinnorhea.   Mouth/Throat: Mucous membranes are moist.   Neck: No  stridor. Hematological/Lymphatic/Immunilogical: No cervical lymphadenopathy. Cardiovascular: Normal rate, regular rhythm.  No murmurs, rubs, or gallops. Respiratory: Normal respiratory effort without tachypnea nor retractions. Breath sounds are clear and equal bilaterally. No wheezes/rales/rhonchi. Gastrointestinal: Soft and nontender. No distention.  Genitourinary: Deferred Musculoskeletal: Normal range of motion in all extremities. No joint effusions.  No lower extremity tenderness nor edema. Neurologic:  Normal speech and language. No gross focal neurologic deficits are appreciated. Speech is normal.  Skin:  Skin is warm, dry. Small abrasion and laceration to nose and left cheek.  Psychiatric: Mood and affect are normal. Speech and behavior are normal. Patient exhibits appropriate insight and judgment.  ____________________________________________    LABS (pertinent positives/negatives)  Labs Reviewed  BASIC METABOLIC PANEL - Abnormal; Notable for the following:    Creatinine, Ser 1.11 (*)    GFR calc non Af Amer 43 (*)    GFR calc Af Amer 50 (*)    All other components within normal limits  URINALYSIS COMPLETEWITH MICROSCOPIC ([facility] ONLY) - Abnormal; Notable for the following:    Color, Urine YELLOW (*)    APPearance CLEAR (*)    Leukocytes, UA 3+ (*)    Bacteria, UA RARE (*)    Squamous Epithelial / LPF 0-5 (*)    All other components within normal limits  CBC  TROPONIN I  CBG MONITORING, ED     ____________________________________________   EKG  I, Nance Pear, attending physician, personally viewed and interpreted this EKG  EKG Time: 1129 Rate: 70 Rhythm: sinus rhythm Axis: normal Intervals: qtc 457, 1st degree av block QRS: narrow ST changes: no st elevation Impression: normal ekg with 1st degree av block ____________________________________________    RADIOLOGY  CT head/cervical spine  IMPRESSION: 1. No evidence of acute intracranial  abnormality. 2. No acute osseous abnormality identified in the cervical spine. Multilevel disc and facet degeneration.  ____________________________________________   PROCEDURES  Procedure(s) performed: None  Critical Care performed: No  ____________________________________________   INITIAL IMPRESSION / ASSESSMENT AND PLAN / ED COURSE  Pertinent labs & imaging results that were available during my care of the patient were reviewed by me and considered in my medical decision making (see chart for details).  Patient presented to the emergency department today after a syncopal episode whilst at church. Patient did suffer abrasions and lacerations to her face. All these are very small and none require advanced closure. CT head and C-spine were negative. In terms of the nature of the patient's syncope her creatinine is a little  elevated, concerning for dehydration, as well as having urine that is concerning for infection. Patient did not have any chest pain and thus I think cardiac etiology less likely. However I did have a long discussion with patient and family about admitting the patient for cardiac rule out. I did discuss with the patient that I could not tell her for sure she did not have a heart attack causing the syncopal episode. The patient verbalized understanding and also verbalized her desire to go home. She appreciated the fact that this could've represented a small heart attack however she feels like if she was could have a larger heart attack she would like to have that at home. Furthermore she also stated she did not want a cardiac catheterization. Because of this I think it is okay that the patient would like to be discharged home. Will give patient a course of antibiotics.   ____________________________________________   FINAL CLINICAL IMPRESSION(S) / ED DIAGNOSES  Final diagnoses:  UTI (lower urinary tract infection)  Syncope, unspecified syncope type     Nance Pear, MD 05/22/15 1501

## 2015-10-04 ENCOUNTER — Other Ambulatory Visit: Payer: Self-pay | Admitting: Neurology

## 2015-10-04 DIAGNOSIS — R55 Syncope and collapse: Secondary | ICD-10-CM

## 2015-10-24 ENCOUNTER — Ambulatory Visit
Admission: RE | Admit: 2015-10-24 | Discharge: 2015-10-24 | Disposition: A | Discharge status: Home / Self Care | Payer: Medicare Other | Source: Ambulatory Visit | Attending: Neurology | Admitting: Neurology

## 2015-10-24 DIAGNOSIS — R4020 Unspecified coma: Secondary | ICD-10-CM | POA: Diagnosis present

## 2015-10-24 DIAGNOSIS — R55 Syncope and collapse: Secondary | ICD-10-CM | POA: Diagnosis present

## 2015-10-24 DIAGNOSIS — G319 Degenerative disease of nervous system, unspecified: Secondary | ICD-10-CM | POA: Insufficient documentation

## 2015-10-24 DIAGNOSIS — I679 Cerebrovascular disease, unspecified: Secondary | ICD-10-CM | POA: Diagnosis not present

## 2015-11-01 ENCOUNTER — Other Ambulatory Visit: Payer: Self-pay | Admitting: Family Medicine

## 2015-11-01 DIAGNOSIS — Z1231 Encounter for screening mammogram for malignant neoplasm of breast: Secondary | ICD-10-CM

## 2016-01-04 ENCOUNTER — Ambulatory Visit
Admission: RE | Admit: 2016-01-04 | Discharge: 2016-01-04 | Disposition: A | Discharge status: Home / Self Care | Payer: Medicare Other | Source: Ambulatory Visit | Attending: Family Medicine | Admitting: Family Medicine

## 2016-01-04 ENCOUNTER — Other Ambulatory Visit: Payer: Self-pay | Admitting: Family Medicine

## 2016-01-04 DIAGNOSIS — Z1231 Encounter for screening mammogram for malignant neoplasm of breast: Secondary | ICD-10-CM | POA: Diagnosis not present

## 2016-01-20 ENCOUNTER — Emergency Department: Payer: Medicare Other

## 2016-01-20 ENCOUNTER — Observation Stay: Payer: Medicare Other

## 2016-01-20 ENCOUNTER — Encounter: Payer: Self-pay | Admitting: Intensive Care

## 2016-01-20 ENCOUNTER — Inpatient Hospital Stay
Admission: EM | Admit: 2016-01-20 | Discharge: 2016-01-24 | DRG: 243 | Disposition: A | Discharge status: Home Health | Payer: Medicare Other | Attending: Internal Medicine | Admitting: Internal Medicine

## 2016-01-20 ENCOUNTER — Inpatient Hospital Stay: Payer: Medicare Other

## 2016-01-20 DIAGNOSIS — I6522 Occlusion and stenosis of left carotid artery: Secondary | ICD-10-CM | POA: Diagnosis present

## 2016-01-20 DIAGNOSIS — Z7982 Long term (current) use of aspirin: Secondary | ICD-10-CM | POA: Diagnosis not present

## 2016-01-20 DIAGNOSIS — T82897A Other specified complication of cardiac prosthetic devices, implants and grafts, initial encounter: Secondary | ICD-10-CM | POA: Diagnosis not present

## 2016-01-20 DIAGNOSIS — Z7902 Long term (current) use of antithrombotics/antiplatelets: Secondary | ICD-10-CM | POA: Diagnosis not present

## 2016-01-20 DIAGNOSIS — Z950 Presence of cardiac pacemaker: Secondary | ICD-10-CM

## 2016-01-20 DIAGNOSIS — F17200 Nicotine dependence, unspecified, uncomplicated: Secondary | ICD-10-CM | POA: Diagnosis present

## 2016-01-20 DIAGNOSIS — I4891 Unspecified atrial fibrillation: Secondary | ICD-10-CM | POA: Diagnosis present

## 2016-01-20 DIAGNOSIS — Y848 Other medical procedures as the cause of abnormal reaction of the patient, or of later complication, without mention of misadventure at the time of the procedure: Secondary | ICD-10-CM | POA: Diagnosis not present

## 2016-01-20 DIAGNOSIS — D509 Iron deficiency anemia, unspecified: Secondary | ICD-10-CM | POA: Diagnosis present

## 2016-01-20 DIAGNOSIS — I10 Essential (primary) hypertension: Secondary | ICD-10-CM | POA: Diagnosis present

## 2016-01-20 DIAGNOSIS — I739 Peripheral vascular disease, unspecified: Secondary | ICD-10-CM | POA: Diagnosis present

## 2016-01-20 DIAGNOSIS — I455 Other specified heart block: Secondary | ICD-10-CM | POA: Diagnosis present

## 2016-01-20 DIAGNOSIS — Z79899 Other long term (current) drug therapy: Secondary | ICD-10-CM

## 2016-01-20 DIAGNOSIS — R001 Bradycardia, unspecified: Secondary | ICD-10-CM | POA: Diagnosis present

## 2016-01-20 DIAGNOSIS — E785 Hyperlipidemia, unspecified: Secondary | ICD-10-CM | POA: Diagnosis present

## 2016-01-20 DIAGNOSIS — I441 Atrioventricular block, second degree: Secondary | ICD-10-CM | POA: Diagnosis present

## 2016-01-20 DIAGNOSIS — M8580 Other specified disorders of bone density and structure, unspecified site: Secondary | ICD-10-CM | POA: Diagnosis present

## 2016-01-20 DIAGNOSIS — I459 Conduction disorder, unspecified: Secondary | ICD-10-CM | POA: Diagnosis present

## 2016-01-20 LAB — CBC WITH DIFFERENTIAL/PLATELET
Basophils Absolute: 0.1 10*3/uL (ref 0–0.1)
Basophils Relative: 1 %
Eosinophils Absolute: 0.2 10*3/uL (ref 0–0.7)
Eosinophils Relative: 2 %
HEMATOCRIT: 41 % (ref 35.0–47.0)
HEMOGLOBIN: 14 g/dL (ref 12.0–16.0)
LYMPHS ABS: 2.1 10*3/uL (ref 1.0–3.6)
Lymphocytes Relative: 27 %
MCH: 32.9 pg (ref 26.0–34.0)
MCHC: 34.1 g/dL (ref 32.0–36.0)
MCV: 96.5 fL (ref 80.0–100.0)
MONO ABS: 0.5 10*3/uL (ref 0.2–0.9)
MONOS PCT: 7 %
NEUTROS ABS: 4.9 10*3/uL (ref 1.4–6.5)
NEUTROS PCT: 63 %
Platelets: 245 10*3/uL (ref 150–440)
RBC: 4.25 MIL/uL (ref 3.80–5.20)
RDW: 14.7 % — ABNORMAL HIGH (ref 11.5–14.5)
WBC: 7.8 10*3/uL (ref 3.6–11.0)

## 2016-01-20 LAB — URINALYSIS COMPLETE WITH MICROSCOPIC (ARMC ONLY)
BILIRUBIN URINE: NEGATIVE
GLUCOSE, UA: NEGATIVE mg/dL
Hgb urine dipstick: NEGATIVE
Ketones, ur: NEGATIVE mg/dL
NITRITE: NEGATIVE
Protein, ur: NEGATIVE mg/dL
Specific Gravity, Urine: 1.011 (ref 1.005–1.030)
pH: 7 (ref 5.0–8.0)

## 2016-01-20 LAB — FIBRIN DERIVATIVES D-DIMER (ARMC ONLY): Fibrin derivatives D-dimer (ARMC): 1005 — ABNORMAL HIGH (ref 0–499)

## 2016-01-20 LAB — COMPREHENSIVE METABOLIC PANEL
ALK PHOS: 54 U/L (ref 38–126)
ALT: 17 U/L (ref 14–54)
ANION GAP: 8 (ref 5–15)
AST: 28 U/L (ref 15–41)
Albumin: 4.1 g/dL (ref 3.5–5.0)
BILIRUBIN TOTAL: 0.6 mg/dL (ref 0.3–1.2)
BUN: 16 mg/dL (ref 6–20)
CALCIUM: 9.2 mg/dL (ref 8.9–10.3)
CO2: 28 mmol/L (ref 22–32)
CREATININE: 1.05 mg/dL — AB (ref 0.44–1.00)
Chloride: 101 mmol/L (ref 101–111)
GFR calc non Af Amer: 46 mL/min — ABNORMAL LOW (ref >60)
GFR, EST AFRICAN AMERICAN: 54 mL/min — AB (ref >60)
GLUCOSE: 97 mg/dL (ref 65–99)
Potassium: 4.3 mmol/L (ref 3.5–5.1)
Sodium: 137 mmol/L (ref 135–145)
Total Protein: 7.2 g/dL (ref 6.5–8.1)

## 2016-01-20 LAB — TROPONIN I
TROPONIN I: 0.12 ng/mL — AB (ref <0.031)
Troponin I: <0.03 ng/mL (ref <0.031)
Troponin I: <0.03 ng/mL (ref <0.031)

## 2016-01-20 LAB — MAGNESIUM: Magnesium: 2.1 mg/dL (ref 1.7–2.4)

## 2016-01-20 LAB — MRSA PCR SCREENING: MRSA BY PCR: NEGATIVE

## 2016-01-20 MED ORDER — BACLOFEN 10 MG PO TABS
10.0000 mg | ORAL_TABLET | Freq: Three times a day (TID) | ORAL | Status: DC
  Administered 2016-01-21 – 2016-01-24 (×8): 10 mg via ORAL
  Filled 2016-01-20 (×11): qty 1

## 2016-01-20 MED ORDER — ASPIRIN EC 81 MG PO TBEC: 81.0000 mg | DELAYED_RELEASE_TABLET | Freq: Every day | ORAL | Status: DC

## 2016-01-20 MED ORDER — ASPIRIN EC 81 MG PO TBEC
81.0000 mg | DELAYED_RELEASE_TABLET | Freq: Every day | ORAL | Status: DC
  Administered 2016-01-21 – 2016-01-23 (×3): 81 mg via ORAL
  Filled 2016-01-20 (×3): qty 1

## 2016-01-20 MED ORDER — VITAMIN D 1000 UNITS PO TABS: 5000.0000 [IU] | ORAL_TABLET | Freq: Every day | ORAL | Status: DC

## 2016-01-20 MED ORDER — ENOXAPARIN SODIUM 40 MG/0.4ML ~~LOC~~ SOLN
40.0000 mg | SUBCUTANEOUS | Status: DC
  Administered 2016-01-20 – 2016-01-22 (×3): 40 mg via SUBCUTANEOUS
  Filled 2016-01-20 (×3): qty 0.4

## 2016-01-20 MED ORDER — DOCUSATE SODIUM 100 MG PO CAPS
100.0000 mg | ORAL_CAPSULE | Freq: Two times a day (BID) | ORAL | Status: DC
  Administered 2016-01-21 – 2016-01-24 (×5): 100 mg via ORAL
  Filled 2016-01-20 (×5): qty 1

## 2016-01-20 MED ORDER — MIDAZOLAM HCL 2 MG/2ML IJ SOLN
INTRAMUSCULAR | Status: AC
Start: 2016-01-20 — End: 2016-01-20
  Administered 2016-01-20: 1 mg via INTRAVENOUS
  Filled 2016-01-20: qty 2

## 2016-01-20 MED ORDER — FERROUS SULFATE 325 (65 FE) MG PO TABS
325.0000 mg | ORAL_TABLET | Freq: Every day | ORAL | Status: DC
  Administered 2016-01-21 – 2016-01-23 (×3): 325 mg via ORAL
  Filled 2016-01-20 (×3): qty 1

## 2016-01-20 MED ORDER — MIDAZOLAM HCL 2 MG/2ML IJ SOLN
1.0000 mg | Freq: Once | INTRAMUSCULAR | Status: AC
  Administered 2016-01-20: 1 mg via INTRAVENOUS

## 2016-01-20 MED ORDER — ACETAMINOPHEN 650 MG RE SUPP: 650.0000 mg | Freq: Four times a day (QID) | RECTAL | Status: DC | PRN

## 2016-01-20 MED ORDER — MAGNESIUM OXIDE 400 (241.3 MG) MG PO TABS: 400.0000 mg | ORAL_TABLET | Freq: Every day | ORAL | Status: DC

## 2016-01-20 MED ORDER — SODIUM CHLORIDE 0.9% FLUSH
3.0000 mL | Freq: Two times a day (BID) | INTRAVENOUS | Status: DC
  Administered 2016-01-20 – 2016-01-24 (×6): 3 mL via INTRAVENOUS

## 2016-01-20 MED ORDER — NICOTINE 21 MG/24HR TD PT24
21.0000 mg | MEDICATED_PATCH | Freq: Every day | TRANSDERMAL | Status: DC
  Administered 2016-01-20 – 2016-01-24 (×5): 21 mg via TRANSDERMAL
  Filled 2016-01-20 (×5): qty 1

## 2016-01-20 MED ORDER — ONDANSETRON HCL 4 MG/2ML IJ SOLN: 4.0000 mg | Freq: Four times a day (QID) | INTRAMUSCULAR | Status: DC | PRN

## 2016-01-20 MED ORDER — ATORVASTATIN CALCIUM 10 MG PO TABS: 10.0000 mg | ORAL_TABLET | Freq: Every day | ORAL | Status: DC

## 2016-01-20 MED ORDER — VITAMIN D 1000 UNITS PO TABS
5000.0000 [IU] | ORAL_TABLET | Freq: Every day | ORAL | Status: DC
  Administered 2016-01-21 – 2016-01-23 (×3): 5000 [IU] via ORAL
  Filled 2016-01-20 (×3): qty 5

## 2016-01-20 MED ORDER — LIDOCAINE HCL 1 % IJ SOLN
5.0000 mL | Freq: Once | INTRAMUSCULAR | Status: DC
  Filled 2016-01-20: qty 5

## 2016-01-20 MED ORDER — CALCIUM CARBONATE ANTACID 500 MG PO CHEW
200.0000 mg | CHEWABLE_TABLET | Freq: Three times a day (TID) | ORAL | Status: DC
  Administered 2016-01-21 – 2016-01-24 (×8): 200 mg via ORAL
  Filled 2016-01-20 (×8): qty 1

## 2016-01-20 MED ORDER — CLOPIDOGREL BISULFATE 75 MG PO TABS: 75.0000 mg | ORAL_TABLET | Freq: Every day | ORAL | Status: DC

## 2016-01-20 MED ORDER — ATROPINE SULFATE 1 MG/10ML IJ SOSY
0.5000 mg | PREFILLED_SYRINGE | INTRAMUSCULAR | Status: DC | PRN
  Filled 2016-01-20: qty 10
  Filled 2016-01-20: qty 5

## 2016-01-20 MED ORDER — MAGNESIUM OXIDE 400 (241.3 MG) MG PO TABS
400.0000 mg | ORAL_TABLET | Freq: Every day | ORAL | Status: DC
Start: 2016-01-20 — End: 2016-01-24
  Administered 2016-01-21 – 2016-01-23 (×3): 400 mg via ORAL
  Filled 2016-01-20 (×3): qty 1

## 2016-01-20 MED ORDER — MIDAZOLAM HCL 2 MG/2ML IJ SOLN: 1.0000 mg | INTRAMUSCULAR | Status: DC | PRN

## 2016-01-20 MED ORDER — ATORVASTATIN CALCIUM 10 MG PO TABS
10.0000 mg | ORAL_TABLET | Freq: Every day | ORAL | Status: DC
  Administered 2016-01-21 – 2016-01-23 (×3): 10 mg via ORAL
  Filled 2016-01-20 (×3): qty 1

## 2016-01-20 MED ORDER — SODIUM CHLORIDE 0.9 % IV SOLN
INTRAVENOUS | Status: AC
  Administered 2016-01-20: 17:00:00 via INTRAVENOUS

## 2016-01-20 MED ORDER — ONDANSETRON HCL 4 MG PO TABS: 4.0000 mg | ORAL_TABLET | Freq: Four times a day (QID) | ORAL | Status: DC | PRN

## 2016-01-20 MED ORDER — MORPHINE SULFATE (PF) 2 MG/ML IV SOLN
2.0000 mg | INTRAVENOUS | Status: DC | PRN
Start: 2016-01-20 — End: 2016-01-24
  Administered 2016-01-20 – 2016-01-24 (×2): 2 mg via INTRAVENOUS
  Filled 2016-01-20 (×2): qty 1

## 2016-01-20 MED ORDER — ACETAMINOPHEN 325 MG PO TABS: 650.0000 mg | ORAL_TABLET | Freq: Four times a day (QID) | ORAL | Status: DC | PRN

## 2016-01-20 NOTE — Care Management Obs Status (Signed)
MEDICARE OBSERVATION STATUS NOTIFICATION   Patient Details  Name: Jamie Spence MRN: [id number] Date of Birth: [birth year]   Medicare Observation Status Notification Given:  Yes    Katrina Stack, RN 01/20/2016, 1:13 PM

## 2016-01-20 NOTE — ED Notes (Signed)
Pt is having multiple episodes of Asystole that last about 1-5 seconds a piece and then patient returns to normal sinus rhythm. PAtient is also having some episodes of A-Fib. MD made ware. Patient is hooked up to the code cart

## 2016-01-20 NOTE — H&P (Signed)
[place] at [place] NAME: Jamie Spence    MR#:  [id number]  DATE OF BIRTH:  12-10-1925  DATE OF ADMISSION:  01/20/2016  PRIMARY CARE PHYSICIAN: Juluis Pitch, MD   REQUESTING/REFERRING PHYSICIAN: Dr.Quigley  CHIEF COMPLAINT:   Dizzy spells HISTORY OF PRESENT ILLNESS:  Jamie Spence  is a 82 y.o. female with a known history of Essential hypertension, peripheral vascular disease, carotid artery disease and multiple other medical problems is brought into the ED with a chief complaint of multiple episodes of dizzy spells but did not completely pass out. Denies any chest pain but feeling dizzy. Telemetry strip has revealed 4-5 second pauses multiple times and patient is converting back to normal sinus rhythm spontaneously. On call cardiologist Dr.Fath has recommended to admit the patient for possible pacemaker placement. Patient denies any symptoms during my examination. Denies any chest pain or shortness of breath. Granddaughter at bedside  PAST MEDICAL HISTORY:   Past Medical History  Diagnosis Date  . Hypertension   . Peripheral vascular disease ([place])   . Carotid artery disease ([place])   . Osteopenia   . Hyperlipemia   . Cancer [facility])     uterine    PAST SURGICAL HISTOIRY:   Past Surgical History  Procedure Laterality Date  . Peripheral vascular catheterization Left 03/09/2015    Procedure: Carotid PTA/Stent Intervention;  Surgeon: [name], MD;  Location: [place] [facility];  Service: Cardiovascular;  Laterality: Left;  . Peripheral vascular catheterization  03/09/2015    Procedure: Carotid Angiography;  Surgeon: [name], MD;  Location: [place] [facility];  Service: Cardiovascular;;    SOCIAL HISTORY:   Social History  Substance Use Topics  . Smoking status: Current Every Day Smoker -- 1.00 packs/day for 60 years  . Smokeless tobacco: Not on file  . Alcohol Use: No    FAMILY HISTORY:  History  reviewed. No pertinent family history.  DRUG ALLERGIES:  No Known Allergies  REVIEW OF SYSTEMS:  CONSTITUTIONAL: No fever, fatigue or weakness.  EYES: No blurred or double vision.  EARS, NOSE, AND THROAT: No tinnitus or ear pain.  RESPIRATORY: No cough, shortness of breath, wheezing or hemoptysis.  CARDIOVASCULAR: No chest pain, orthopnea, edema.  GASTROINTESTINAL: No nausea, vomiting, diarrhea or abdominal pain.  GENITOURINARY: No dysuria, hematuria.  ENDOCRINE: No polyuria, nocturia,  HEMATOLOGY: No anemia, easy bruising or bleeding SKIN: No rash or lesion. MUSCULOSKELETAL: No joint pain or arthritis.   NEUROLOGIC: No tingling, numbness, weakness.  PSYCHIATRY: No anxiety or depression.   MEDICATIONS AT HOME:   Prior to Admission medications   Medication Sig Start Date End Date Taking? Authorizing Provider  aspirin EC 81 MG tablet Take 81 mg by mouth daily.   Yes Historical Provider, MD  atorvastatin (LIPITOR) 10 MG tablet Take 10 mg by mouth daily.   Yes Historical Provider, MD  baclofen (LIORESAL) 10 MG tablet Take 10 mg by mouth 3 (three) times daily.   Yes Historical Provider, MD  calcium gluconate 500 MG tablet Take 1 tablet by mouth 3 (three) times daily.   Yes Historical Provider, MD  cholecalciferol (VITAMIN D) 1000 UNITS tablet Take 5,000 Units by mouth daily.    Yes Historical Provider, MD  clopidogrel (PLAVIX) 75 MG tablet Take 1 tablet (75 mg total) by mouth daily. 03/10/15  Yes [name], MD  docusate sodium (COLACE) 100 MG capsule Take 100 mg by mouth 2 (two) times daily.   Yes  Historical Provider, MD  ferrous sulfate (FER-IN-SOL) 75 (15 FE) MG/ML SOLN Take 65 mg by mouth.   Yes Historical Provider, MD  magnesium oxide (MAG-OX) 400 MG tablet Take 400 mg by mouth daily.   Yes Historical Provider, MD      VITAL SIGNS:  Blood pressure 160/69, pulse 79, temperature 98.1 F (36.7 C), temperature source Oral, resp. rate 27, weight 59.693 kg (131 lb 9.6 oz), SpO2  97 %.  PHYSICAL EXAMINATION:  GENERAL:  82 y.o.-year-old patient lying in the bed with no acute distress.  EYES: Pupils equal, round, reactive to light and accommodation. No scleral icterus. Extraocular muscles intact.  HEENT: Head atraumatic, normocephalic. Oropharynx and nasopharynx clear.  NECK:  Supple, no jugular venous distention. No thyroid enlargement, no tenderness.  LUNGS: Normal breath sounds bilaterally, no wheezing, rales,rhonchi or crepitation. No use of accessory muscles of respiration.  CARDIOVASCULAR: S1, S2 normal. No murmurs, rubs, or gallops.  ABDOMEN: Soft, nontender, nondistended. Bowel sounds present. No organomegaly or mass.  EXTREMITIES: No pedal edema, cyanosis, or clubbing.  NEUROLOGIC: Cranial nerves II through XII are intact. Muscle strength 5/5 in all extremities. Sensation intact. Gait not checked.  PSYCHIATRIC: The patient is alert and oriented x 3.  SKIN: No obvious rash, lesion, or ulcer.   LABORATORY PANEL:   CBC  Recent Labs Lab 01/20/16 0854  WBC 7.8  HGB 14.0  HCT 41.0  PLT 245   ------------------------------------------------------------------------------------------------------------------  Chemistries   Recent Labs Lab 01/20/16 0854  NA 137  K 4.3  CL 101  CO2 28  GLUCOSE 97  BUN 16  CREATININE 1.05*  CALCIUM 9.2  MG 2.1  AST 28  ALT 17  ALKPHOS 54  BILITOT 0.6   ------------------------------------------------------------------------------------------------------------------  Cardiac Enzymes  Recent Labs Lab 01/20/16 0854  TROPONINI <0.03   ------------------------------------------------------------------------------------------------------------------  RADIOLOGY:  Dg Chest Port 1 View  01/20/2016  CLINICAL DATA:  Near syncope. EXAM: PORTABLE CHEST 1 VIEW COMPARISON:  01/16/2014 FINDINGS: Prominent interstitial lung markings appear chronic. There is no focal airspace disease and no frank pulmonary edema.  Retrocardiac density is suggestive for a hiatal hernia. Heart size is within normal limits and stable. Negative for a pneumothorax. IMPRESSION: No acute chest findings. Electronically Signed   By: Markus Daft M.D.   On: 01/20/2016 09:16    EKG:   Orders placed or performed during the hospital encounter of 01/20/16  . EKG 12-Lead  . EKG 12-Lead    IMPRESSION AND PLAN:   Averey Tonjes  is a 82 y.o. female with a known history of Essential hypertension, peripheral vascular disease, carotid artery disease and multiple other medical problems is brought into the ED with a chief complaint of multiple episodes of dizzy spells but did not completely pass out. Denies any chest pain but feeling dizzy. Telemetry strip has revealed 4-5 second pauses multiple times and patient is converting back to normal sinus rhythm spontaneously. On call cardiologist Dr.Fath has recommended to admit the patient for possible pacemaker placement.   #Near syncope spells secondary to multiple cardiac pauses and  second degree2 heart block Admit to telemetry under observation Atropine 0.5 mg IV as needed Transcutaneous pacemaker if no improvement with medication as needed basis Cardiology consult is placed and discussed with Dr. Ubaldo Glassing  might consider permanent pacemaker after talking to the patient and evaluating the telemetry strips We'll check TSH Avoid rate limiting drugs  #Essential hypertension Blood pressure is elevated probably stress related. Provide low-sodium diet. Patient is not on any home  medications We will  start the patient on lisinopril  #Peripheral vascular disease Continue aspirin and statin  #History of TIAs and left carotid artery stenosis status post and placement Continue aspirin and statin  #Hyperlipidemia continue Lipitor    Provide DVT prophylaxis    All the records are reviewed and case discussed with ED provider. Management plans discussed with the patient, family and they are in  agreement.  CODE STATUS: Full code, granddaughter Caleen Essex is the healthcare power of attorney  TOTAL TIME TAKING CARE OF THIS PATIENT: 45 minutes.    Nicholes Mango M.D on 01/20/2016 at 1:09 PM  Between 7am to 6pm - Pager - [phone]  After 6pm go to [email or web address] - password [email or web address] [place] [facility]  Office  [phone]  CC: Primary care physician; Juluis Pitch, MD   [place] at [facility]

## 2016-01-20 NOTE — Consult Note (Signed)
Jamie Spence  CARDIOLOGY CONSULT NOTE  Patient ID: Jamie Spence MRN: [id number] DOB/AGE: [birth year] 82 y.o.  Admit date: 01/20/2016 Referring Physician Dr. Margaretmary Spence Primary Physician   Primary Cardiologist Dr. Saralyn Spence Reason for Consultation pauses  HPI: Pt is a 19 you female with history of "spells" since September of last year. She states she has episodes of where she blacks out after some warning. She has been evaluated by Dr. Saralyn Spence with holter monitor and event monitors which was unremarkable. She underwent a functional study with moderate apical ischemia prior to consideration for carotid endarterectomy. No cardiac cath was completed due to lack of chest pain. She denies chest pain. She states she has had more frequent episodes of "spells today and presented ot the er where she was noted to have intermitant pauses of 3-5 seconds by report from the er. SHe was admitted ot telemetry where she has episodes of nonconducted p waves. Potassium was normal. SHe is not on any av nodal meds. She has ruled out for mi and electolytes are normal  Review of Systems  HENT: Negative.   Eyes: Negative.   Respiratory: Negative.   Cardiovascular: Negative.   Gastrointestinal: Negative.   Genitourinary: Negative.   Musculoskeletal: Negative.   Skin: Negative.   Neurological: Positive for weakness.  Endo/Heme/Allergies: Negative.     Past Medical History  Diagnosis Date  . Hypertension   . Peripheral vascular disease ([place])   . Carotid artery disease ([place])   . Osteopenia   . Hyperlipemia   . Cancer [facility])     uterine    History reviewed. No pertinent family history.  Social History   Social History  . Marital Status: Widowed    Spouse Name: N/A  . Number of Children: N/A  . Years of Education: N/A   Occupational History  . Not on file.   Social History Main Topics  . Smoking status: Current Every Day Smoker -- 1.00 packs/day for 60 years   . Smokeless tobacco: Not on file  . Alcohol Use: No  . Drug Use: Not on file  . Sexual Activity: Not on file   Other Topics Concern  . Not on file   Social History Narrative    Past Surgical History  Procedure Laterality Date  . Peripheral vascular catheterization Left 03/09/2015    Procedure: Carotid PTA/Stent Intervention;  Surgeon: Jamie Cabal, MD;  Location: [place] [facility];  Service: Cardiovascular;  Laterality: Left;  . Peripheral vascular catheterization  03/09/2015    Procedure: Carotid Angiography;  Surgeon: Jamie Cabal, MD;  Location: [place] [facility];  Service: Cardiovascular;;     Prescriptions prior to admission  Medication Sig Dispense Refill Last Dose  . aspirin EC 81 MG tablet Take 81 mg by mouth daily.   01/19/2016 at 2100  . atorvastatin (LIPITOR) 10 MG tablet Take 10 mg by mouth daily.   01/19/2016 at 2100  . baclofen (LIORESAL) 10 MG tablet Take 10 mg by mouth 3 (three) times daily.   01/19/2016 at 2100  . calcium gluconate 500 MG tablet Take 1 tablet by mouth 3 (three) times daily.   01/19/2016 at 2100  . cholecalciferol (VITAMIN D) 1000 UNITS tablet Take 5,000 Units by mouth daily.    01/19/2016 at 2100  . clopidogrel (PLAVIX) 75 MG tablet Take 1 tablet (75 mg total) by mouth daily. 30 tablet 5 01/19/2016 at 2100  . docusate sodium (COLACE) 100 MG capsule Take 100 mg  by mouth 2 (two) times daily.   01/19/2016 at 2100  . ferrous sulfate (FER-IN-SOL) 75 (15 FE) MG/ML SOLN Take 65 mg by mouth.   01/19/2016 at 2100  . magnesium oxide (MAG-OX) 400 MG tablet Take 400 mg by mouth daily.   01/19/2016 at 2100    Physical Exam: Blood pressure 154/58, pulse 75, temperature 98.2 F (36.8 C), temperature source Oral, resp. rate 18, height 5\' 3"  (1.6 m), weight 57.607 kg (127 lb), SpO2 97 %.   Wt Readings from Last 1 Encounters:  01/20/16 57.607 kg (127 lb)     General appearance: alert and cooperative Back: symmetric, no curvature. ROM normal. No CVA  tenderness. Resp: clear to auscultation bilaterally Cardio: regular rate and rhythm and with pauses Extremities: extremities normal, atraumatic, no cyanosis or edema Neurologic: Grossly normal  Labs:   Sp[facility] Results  Component Value Date   WBC 7.8 01/20/2016   HGB 14.0 01/20/2016   HCT 41.0 01/20/2016   MCV 96.5 01/20/2016   PLT 245 01/20/2016    Recent Labs Spence 01/20/16 0854  NA 137  K 4.3  CL 101  CO2 28  BUN 16  CREATININE 1.05*  CALCIUM 9.2  PROT 7.2  BILITOT 0.6  ALKPHOS 54  ALT 17  AST 28  GLUCOSE 97   Spence Results  Component Value Date   TROPONINI <0.03 01/20/2016      Radiology: No acute cardiopulmonary disease EKG: Sinus rhythm with intermitant pauses.   ASSESSMENT AND PLAN:  Pt with history of tia and episodes of spells who was admitted after noting syncope and in the er was noted to have intermitant pauses. She is asympotmatic in between her episodes. She will need ppm which is scheduled for Monday. Will consider temp pacer if she has persistant bradcyardia or more prolonged pauses. Will hold plavix and any av nodal meds.  Signed: Teodoro Spray MD, [facility] 01/20/2016, 4:03 PM

## 2016-01-20 NOTE — Care Management (Signed)
Cardiology will follow and anticipate pacemaker insertion 5/15.

## 2016-01-20 NOTE — Progress Notes (Addendum)
Patient admitted today around 1530. Since admission patient has had 5 pauses lasting anywhere from 8 seconds to 13 seconds. Around 1630 patient had 3 of the pauses back to back. Per CCMD one of the pauses in the ED was over 16 seconds. When patient has episodes she looks like she is suffocating and has a blank stare while she sucks in air. Episodes visibly only last a few seconds. Dr. Ubaldo Glassing has decided to put in a temporary pacemaker per the patient and family request. Report called to [facility] in ICU. Patient transported now with her belongings.

## 2016-01-20 NOTE — Progress Notes (Signed)
Procedure:  Temp transvenous pacemaker  After informed consent and discussion with pt and her granddaughter, right femoral region was prepped and anesthetized with 1% xylocaine. 6 fr sheath placed in right femoral vein. SG trnasvenous pacemaker was inserted into right femoral vein and advanced into the ra and rv. PPacing spikes obtianed. Pacer set at  50 bpm, Ma 20, sens 1.5. CXR obtained. No immediate compications.

## 2016-01-20 NOTE — Progress Notes (Signed)
Will transfer to icu and place temp pacer at request of family and patient. Risk and benefits explained

## 2016-01-20 NOTE — ED Notes (Signed)
Pt arrived by EMS from [facility]. Pt reports she was sitting at table waiting on breakfast and became dizziness and had a syncopal episode. Pt denies LOC or falling. While triaging patient she had another episode and face became very red and patient stiffened up for about 10 seconds. Pt is A&O X4. Pt states she has been seen for these episodes before

## 2016-01-20 NOTE — Progress Notes (Signed)
Patient admitted to ICU for temp pacemaker placed by Dr Ubaldo Glassing. Confirmed by xray. Rate 50... MA...20 and Sensitivity.... 1.5. Patient has no complaints of pain and requesting her dinner. Per Dr Ubaldo Glassing patient can eat and sit up in a 45 degree angle. Orders for bedrest and she can use bedpan.

## 2016-01-20 NOTE — ED Provider Notes (Signed)
Time Seen: Approximately 0 910 I have reviewed the triage notes  Chief Complaint: Near Syncope   History of Present Illness: Jamie Spence is a 82 y.o. female who states that she's had "" spells"" since September. Patient was seen by cardiology along with neurology. She has a known history of carotid artery disease on the left and received a stent. The patient's source of her spells is unknown at this time. She had a negative seizure workup and it sounds like a negative cardiac workup though can't specify exactly what studies were performed. The patient was at the restaurant today and was sitting at a table and became somewhat dizzy and had a brief syncopal episode. She apparently woke up rather quickly and did not injure herself. The bystanders state that her face turned red and she said "" stiffened up for 10 seconds "". The patient states these are like the episodes that she's had before in the past. She states that she feels the episodes coming on and describes them as feeling as though her "" heart has stopped "". The patient denies any chest pain, nausea, vomiting, shortness of breath.   Past Medical History  Diagnosis Date  . Hypertension   . Peripheral vascular disease ([name])   . Carotid artery disease ([name])   . Osteopenia   . Hyperlipemia   . Cancer [name])     uterine    Patient Active Problem List   Diagnosis Date Noted  . Second-degree heart block 01/20/2016  . Carotid stenosis 03/09/2015    Past Surgical History  Procedure Laterality Date  . Peripheral vascular catheterization Left 03/09/2015    Procedure: Carotid PTA/Stent Intervention;  Surgeon: [name], MD;  Location: [place] [facility];  Service: Cardiovascular;  Laterality: Left;  . Peripheral vascular catheterization  03/09/2015    Procedure: Carotid Angiography;  Surgeon: [name], MD;  Location: [place] [facility];  Service: Cardiovascular;;    Past Surgical History  Procedure  Laterality Date  . Peripheral vascular catheterization Left 03/09/2015    Procedure: Carotid PTA/Stent Intervention;  Surgeon: [name], MD;  Location: [place] [facility];  Service: Cardiovascular;  Laterality: Left;  . Peripheral vascular catheterization  03/09/2015    Procedure: Carotid Angiography;  Surgeon: [name], MD;  Location: [place] [facility];  Service: Cardiovascular;;    Current Outpatient Rx  Name  Route  Sig  Dispense  Refill  . aspirin EC 81 MG tablet   Oral   Take 81 mg by mouth daily.         Marland Kitchen atorvastatin (LIPITOR) 10 MG tablet   Oral   Take 10 mg by mouth daily.         . baclofen (LIORESAL) 10 MG tablet   Oral   Take 10 mg by mouth 3 (three) times daily.         . calcium gluconate 500 MG tablet   Oral   Take 1 tablet by mouth 3 (three) times daily.         . cholecalciferol (VITAMIN D) 1000 UNITS tablet   Oral   Take 5,000 Units by mouth daily.          . clopidogrel (PLAVIX) 75 MG tablet   Oral   Take 1 tablet (75 mg total) by mouth daily.   30 tablet   5   . docusate sodium (COLACE) 100 MG capsule   Oral   Take 100 mg by mouth  2 (two) times daily.         . ferrous sulfate (FER-IN-SOL) 75 (15 FE) MG/ML SOLN   Oral   Take 65 mg by mouth.         . magnesium oxide (MAG-OX) 400 MG tablet   Oral   Take 400 mg by mouth daily.           Allergies:  Review of patient's allergies indicates no known allergies.  Family History: History reviewed. No pertinent family history.  Social History: Social History  Substance Use Topics  . Smoking status: Current Every Day Smoker -- 1.00 packs/day for 60 years  . Smokeless tobacco: None  . Alcohol Use: No     Review of Systems:   10 point review of systems was performed and was otherwise negative:  Constitutional: No fever Eyes: No visual disturbances ENT: No sore throat, ear pain Cardiac: No chest pain Respiratory: No shortness of breath, wheezing, or  stridor Abdomen: No abdominal pain, no vomiting, No diarrhea Endocrine: No weight loss, No night sweats Extremities: No peripheral edema, cyanosis Skin: No rashes, easy bruising Neurologic: No focal weakness, trouble with speech or swollowing Urologic: No dysuria, Hematuria, or urinary frequency   Physical Exam:  ED Triage Vitals  Enc Vitals Group     BP 01/20/16 0900 162/63 mmHg     Pulse Rate 01/20/16 0844 74     Resp 01/20/16 0844 15     Temp 01/20/16 0844 98.1 F (36.7 C)     Temp Source 01/20/16 0844 Oral     SpO2 01/20/16 0844 100 %     Weight 01/20/16 0844 131 lb 9.6 oz (59.693 kg)     Height --      Head Cir --      Peak Flow --      Pain Score --      Pain Loc --      Pain Edu? --      Excl. in [place]? --     General: Awake , Alert , and Oriented times 3; GCS 15 Head: Normal cephalic , atraumatic Eyes: Pupils equal , round, reactive to light Nose/Throat: No nasal drainage, patent upper airway without erythema or exudate.  Neck: Supple, Full range of motion, No anterior adenopathy or palpable thyroid masses Lungs: Clear to ascultation without wheezes , rhonchi, or rales Heart: Regular rate, regular rhythm without murmurs , gallops , or rubs Abdomen: Soft, non tender without rebound, guarding , or rigidity; bowel sounds positive and symmetric in all 4 quadrants. No organomegaly .        Extremities: 2 plus symmetric pulses. No edema, clubbing or cyanosis Neurologic: normal ambulation, Motor symmetric without deficits, sensory intact Skin: warm, dry, no rashes   Labs:   All laboratory work was reviewed including any pertinent negatives or positives listed below:  Labs Reviewed  COMPREHENSIVE METABOLIC PANEL - Abnormal; Notable for the following:    Creatinine, Ser 1.05 (*)    GFR calc non Af Amer 46 (*)    GFR calc Af Amer 54 (*)    All other components within normal limits  CBC WITH DIFFERENTIAL/PLATELET - Abnormal; Notable for the following:    RDW 14.7 (*)     All other components within normal limits  FIBRIN DERIVATIVES D-DIMER ([facility] ONLY) - Abnormal; Notable for the following:    Fibrin derivatives D-dimer ([facility]) 1005 (*)    All other components within normal limits  URINALYSIS COMPLETEWITH MICROSCOPIC ([facility] ONLY) - Abnormal;  Notable for the following:    Color, Urine YELLOW (*)    APPearance CLOUDY (*)    Leukocytes, UA 2+ (*)    Bacteria, UA RARE (*)    Squamous Epithelial / LPF 0-5 (*)    All other components within normal limits  TROPONIN I  MAGNESIUM    EKG: * ED ECG REPORT I, Daymon Larsen, the attending physician, personally viewed and interpreted this ECG.  Date: 01/20/2016 EKG Time: 0844 Rate: 74 Rhythm: normal sinus rhythm QRS Axis: normal Intervals: First-degree AV block ST/T Wave abnormalities: normal Conduction Disturbances: none Narrative Interpretation: unremarkable No acute ischemic changes   Radiology:   Final result by Rad Results In Interface (01/20/16 09:16:30)   Narrative:   CLINICAL DATA: Near syncope.  EXAM: PORTABLE CHEST 1 VIEW  COMPARISON: 01/16/2014  FINDINGS: Prominent interstitial lung markings appear chronic. There is no focal airspace disease and no frank pulmonary edema. Retrocardiac density is suggestive for a hiatal hernia. Heart size is within normal limits and stable. Negative for a pneumothorax.  IMPRESSION: No acute chest findings.   Electronically Signed By: Markus Daft M.D. On: 01/20/2016 09:16      I personally reviewed the radiologic studies  CRITICAL CARE Performed by: Daymon Larsen   Total critical care time: 35 minutes  Critical care time was exclusive of separately billable procedures and treating other patients.  Critical care was necessary to treat or prevent imminent or life-threatening deterioration.  Critical care was time spent personally by me on the following activities: development of treatment plan with patient and/or surrogate as  well as nursing, discussions with consultants, evaluation of patient's response to treatment, examination of patient, obtaining history from patient or surrogate, ordering and performing treatments and interventions, ordering and review of laboratory studies, ordering and review of radiographic studies, pulse oximetry and re-evaluation of patient's condition.  Workup for significant second-degree AV block  ED Course:  Patient's stay here showed some episodes of nonconducted P waves with the no continued QRS component. Parents of second-degree AV block of significance with almost 3-5 second pauses where the patient would have no QRS activity. There is no obvious ischemic changes and this most likely explains the patient's syncopal and near syncopal episode she describes as spells. Patient while on the cardiac monitor also had an episode of atrial fibrillation with all indications of possible sick sinus syndrome. I reviewed the case with the patient's cardiologist and all parties agree that she needs a pacemaker though trying to make that happen and immediate future is difficult. The patient has these episodes frequently enough that I felt concerned about discharging her here from the emergency department. Not required any drips and we have not had the pacer here in the emergency department though have the pacemaker in place if needed. Patient's case was reviewed with the hospitalist team is agreed to see and evaluate the patient have her observed inpatient until cardiac intervention   Assessment: Mobitz type II second-degree AV block  Final Clinical Impression:   Final diagnoses:  Acquired heart block     Plan: * Inpatient            Daymon Larsen, MD 01/20/16 1452

## 2016-01-20 NOTE — Progress Notes (Signed)
Called for pacemaker not capturing. Repositioned pacemaker under fluoro guidance. Capture at 10-20 mA. Will follow

## 2016-01-21 ENCOUNTER — Inpatient Hospital Stay
Admit: 2016-01-21 | Discharge: 2016-01-21 | Disposition: A | Discharge status: Home / Self Care | Payer: Medicare Other | Attending: Internal Medicine | Admitting: Internal Medicine

## 2016-01-21 LAB — COMPREHENSIVE METABOLIC PANEL
ALBUMIN: 3.3 g/dL — AB (ref 3.5–5.0)
ALT: 14 U/L (ref 14–54)
AST: 22 U/L (ref 15–41)
Alkaline Phosphatase: 43 U/L (ref 38–126)
Anion gap: 7 (ref 5–15)
BUN: 15 mg/dL (ref 6–20)
CHLORIDE: 106 mmol/L (ref 101–111)
CO2: 26 mmol/L (ref 22–32)
Calcium: 8.6 mg/dL — ABNORMAL LOW (ref 8.9–10.3)
Creatinine, Ser: 0.92 mg/dL (ref 0.44–1.00)
GFR calc Af Amer: >60 mL/min (ref >60)
GFR calc non Af Amer: 54 mL/min — ABNORMAL LOW (ref >60)
GLUCOSE: 99 mg/dL (ref 65–99)
POTASSIUM: 4.1 mmol/L (ref 3.5–5.1)
Sodium: 139 mmol/L (ref 135–145)
Total Bilirubin: 0.6 mg/dL (ref 0.3–1.2)
Total Protein: 6.1 g/dL — ABNORMAL LOW (ref 6.5–8.1)

## 2016-01-21 LAB — TSH: TSH: 2.218 u[IU]/mL (ref 0.350–4.500)

## 2016-01-21 LAB — CBC
HEMATOCRIT: 37.7 % (ref 35.0–47.0)
Hemoglobin: 12.6 g/dL (ref 12.0–16.0)
MCH: 32.8 pg (ref 26.0–34.0)
MCHC: 33.4 g/dL (ref 32.0–36.0)
MCV: 98.3 fL (ref 80.0–100.0)
PLATELETS: 221 10*3/uL (ref 150–440)
RBC: 3.83 MIL/uL (ref 3.80–5.20)
RDW: 14.6 % — AB (ref 11.5–14.5)
WBC: 8.3 10*3/uL (ref 3.6–11.0)

## 2016-01-21 NOTE — Progress Notes (Signed)
Troponin 0.12, notified Fath who said he will continue to monitor. No new orders received.

## 2016-01-21 NOTE — Progress Notes (Signed)
[facility]  SUBJECTIVE: Feels a little tired this morning but better than yesterday. Had a temporary pacemaker placed which had to be repositioned due to prolonged 10-12 second pauses. No further pauses in the past 12-14 hours.   Filed Vitals:   01/21/16 0400 01/21/16 0500 01/21/16 0600 01/21/16 0747  BP: 91/34 130/71 118/59 97/60  Pulse: 73 83 77 79  Temp:      TempSrc:      Resp: 18 20 25 19   Height:      Weight:      SpO2: 92% 93% 94% 93%    Intake/Output Summary (Last 24 hours) at 01/21/16 0920 Last data filed at 01/21/16 0515  Gross per 24 hour  Intake      3 ml  Output    250 ml  Net   -247 ml    LABS: Basic Metabolic Panel:  Recent Labs  01/20/16 0854 01/21/16 0439  NA 137 139  K 4.3 4.1  CL 101 106  CO2 28 26  GLUCOSE 97 99  BUN 16 15  CREATININE 1.05* 0.92  CALCIUM 9.2 8.6*  MG 2.1  --    Liver Function Tests:  Recent Labs  01/20/16 0854 01/21/16 0439  AST 28 22  ALT 17 14  ALKPHOS 54 43  BILITOT 0.6 0.6  PROT 7.2 6.1*  ALBUMIN 4.1 3.3*   No results for input(s): LIPASE, AMYLASE in the last 72 hours. CBC:  Recent Labs  01/20/16 0854 01/21/16 0439  WBC 7.8 8.3  NEUTROABS 4.9  --   HGB 14.0 12.6  HCT 41.0 37.7  MCV 96.5 98.3  PLT 245 221   Cardiac Enzymes:  Recent Labs  01/20/16 0854 01/20/16 1513 01/20/16 2111  TROPONINI <0.03 <0.03 0.12*   BNP: Invalid input(s): POCBNP D-Dimer: No results for input(s): DDIMER in the last 72 hours. Hemoglobin A1C: No results for input(s): HGBA1C in the last 72 hours. Fasting Lipid Panel: No results for input(s): CHOL, HDL, LDLCALC, TRIG, CHOLHDL, LDLDIRECT in the last 72 hours. Thyroid Function Tests:  Recent Labs  01/21/16 0439  TSH 2.218   Anemia Panel: No results for input(s): VITAMINB12, FOLATE, FERRITIN, TIBC, IRON, RETICCTPCT in the last 72 hours.   Physical Exam: Blood pressure 97/60, pulse 79, temperature 98.1 F (36.7 C),  temperature source Oral, resp. rate 19, height 5\' 3"  (1.6 m), weight 57.607 kg (127 lb), SpO2 93 %.   Wt Readings from Last 1 Encounters:  01/20/16 57.607 kg (127 lb)     General appearance: alert and cooperative Head: Normocephalic, without obvious abnormality, atraumatic Resp: clear to auscultation bilaterally Cardio: regular rate and rhythm GI: soft, non-tender; bowel sounds normal; no masses,  no organomegaly Pulses: 2+ and symmetric Neurologic: Grossly normal  TELEMETRY: Reviewed telemetry pt in sinus rhythm with intermittently ventricular paced beats.  ASSESSMENT AND PLAN:  Active Problems:   Acquired heart block-intermittent AV nodal block. Had 10-12 second pauses. Currently with a temporary pacemaker via the femoral approach. No further positive since device was repositioned. Will need permanent pacemaker placed prior to discharge. We'll continue a temporary device at present and follow.    Teodoro Spray., MD, [facility] 01/21/2016 9:20 AM

## 2016-01-21 NOTE — Progress Notes (Signed)
[place] at [place] NAME: Jamie Spence    MR#:  [id number]  DATE OF BIRTH:  [birth year]  SUBJECTIVE:   Patient here with symptomatic bradycardia. She had temporary pacemaker placed. No further positives over the past 12 hours.  REVIEW OF SYSTEMS:    Review of Systems  Constitutional: Positive for malaise/fatigue. Negative for fever and chills.  HENT: Negative for ear discharge, ear pain, hearing loss, nosebleeds and sore throat.   Eyes: Negative for blurred vision and pain.  Respiratory: Negative for cough, hemoptysis, shortness of breath and wheezing.   Cardiovascular: Negative for chest pain, palpitations and leg swelling.  Gastrointestinal: Negative for nausea, vomiting, abdominal pain, diarrhea and blood in stool.  Genitourinary: Negative for dysuria.  Musculoskeletal: Negative for back pain.  Neurological: Negative for dizziness, tremors, speech change, focal weakness, seizures and headaches.  Endo/Heme/Allergies: Does not bruise/bleed easily.  Psychiatric/Behavioral: Negative for depression, suicidal ideas and hallucinations.    Tolerating Diet:yes      DRUG ALLERGIES:  No Known Allergies  VITALS:  Blood pressure 97/60, pulse 79, temperature 98.1 F (36.7 C), temperature source Oral, resp. rate 19, height 5\' 3"  (1.6 m), weight 57.607 kg (127 lb), SpO2 93 %.  PHYSICAL EXAMINATION:   Physical Exam  Constitutional: She is oriented to person, place, and time and well-developed, well-nourished, and in no distress. No distress.  HENT:  Head: Normocephalic.  Eyes: No scleral icterus.  Neck: Normal range of motion. Neck supple. No JVD present. No tracheal deviation present.  Cardiovascular: Normal rate, regular rhythm and normal heart sounds.  Exam reveals no gallop and no friction rub.   No murmur heard. Pulmonary/Chest: Effort normal and breath sounds normal. No respiratory distress. She has no wheezes. She has no rales.  She exhibits no tenderness.  Abdominal: Soft. Bowel sounds are normal. She exhibits no distension and no mass. There is no tenderness. There is no rebound and no guarding.  Musculoskeletal: Normal range of motion. She exhibits no edema.  Neurological: She is alert and oriented to person, place, and time.  Skin: Skin is warm. No rash noted. No erythema.  Psychiatric: Affect and judgment normal.      LABORATORY PANEL:   CBC  Recent Labs Lab 01/21/16 0439  WBC 8.3  HGB 12.6  HCT 37.7  PLT 221   ------------------------------------------------------------------------------------------------------------------  Chemistries   Recent Labs Lab 01/20/16 0854 01/21/16 0439  NA 137 139  K 4.3 4.1  CL 101 106  CO2 28 26  GLUCOSE 97 99  BUN 16 15  CREATININE 1.05* 0.92  CALCIUM 9.2 8.6*  MG 2.1  --   AST 28 22  ALT 17 14  ALKPHOS 54 43  BILITOT 0.6 0.6   ------------------------------------------------------------------------------------------------------------------  Cardiac Enzymes  Recent Labs Lab 01/20/16 0854 01/20/16 1513 01/20/16 2111  TROPONINI <0.03 <0.03 0.12*   ------------------------------------------------------------------------------------------------------------------  RADIOLOGY:  Dg Chest Port 1 View  01/20/2016  CLINICAL DATA:  The coronary artery disease and peripheral vascular disease. Hypertension. Hyperlipidemia. Second degree heart block. EXAM: PORTABLE CHEST 1 VIEW COMPARISON:  01/20/2016 FINDINGS: The heart size and mediastinal contours are within normal limits. Patient is partially rotated to the right. Both lungs are clear. No evidence of pulmonary infiltrate or pleural effusion. No evidence of pulmonary edema. IMPRESSION: No active disease. Electronically Signed   By: Earle Gell M.D.   On: 01/20/2016 18:37   Dg Chest Port 1 View  01/20/2016  CLINICAL DATA:  Near syncope. EXAM: PORTABLE  CHEST 1 VIEW COMPARISON:  01/16/2014 FINDINGS:  Prominent interstitial lung markings appear chronic. There is no focal airspace disease and no frank pulmonary edema. Retrocardiac density is suggestive for a hiatal hernia. Heart size is within normal limits and stable. Negative for a pneumothorax. IMPRESSION: No acute chest findings. Electronically Signed   By: Markus Daft M.D.   On: 01/20/2016 09:16     ASSESSMENT AND PLAN:   82 year old female with history of hyperlipidemia who presented with symptomatic bradycardia.  1. Acquired heart block intermittent AV nodal block: Plan for pacemaker on Monday. Patient has temporary one until permanent pacemaker is placed.  2. Hyperlipidemia: Continue atorvastatin.  3. Tobacco dependence: Continue nicotine patch.  4. Iron deficiency anemia: Continue ferrous sulfate.     Management plans discussed with the patient and she is in agreement. /dw family   CODE STATUS: full  CRITICAL CARE TOTAL TIME TAKING CARE OF THIS PATIENT: 33 minutes.  patient critically ill with risk of cardiac decompensation continue ICU monitoring   POSSIBLE D/C 3- days, DEPENDING ON CLINICAL CONDITION.   La Shehan M.D on 01/21/2016 at 11:45 AM  Between 7am to 6pm - Pager - [phone] After 6pm go to [email or web address] - password [email or web address] [facility]  Office  [phone]  CC: Primary care physician; Juluis Pitch, MD  Note: This dictation was prepared with Dragon dictation along with smaller phrase technology. Any transcriptional errors that result from this process are unintentional.

## 2016-01-21 NOTE — Progress Notes (Signed)
Pt in ventricular standstill, 12 s pause. Notified Dr. Ubaldo Glassing who is on the way in. No new orders received at this time.

## 2016-01-22 LAB — ECHOCARDIOGRAM COMPLETE
Height: 63 in
WEIGHTICAEL: 2032 [oz_av]

## 2016-01-22 MED ORDER — SODIUM CHLORIDE 0.9 % IR SOLN
80.0000 mg | Status: DC
  Filled 2016-01-22 (×2): qty 2

## 2016-01-22 MED ORDER — CEFAZOLIN SODIUM-DEXTROSE 2-4 GM/100ML-% IV SOLN
2.0000 g | INTRAVENOUS | Status: AC
  Administered 2016-01-23: 2 g via INTRAVENOUS
  Filled 2016-01-22: qty 100

## 2016-01-22 MED ORDER — CHLORHEXIDINE GLUCONATE 4 % EX LIQD
60.0000 mL | Freq: Once | CUTANEOUS | Status: AC
  Administered 2016-01-23: 4 via TOPICAL

## 2016-01-22 MED ORDER — SODIUM CHLORIDE 0.9 % IV SOLN
INTRAVENOUS | Status: DC
  Administered 2016-01-23 – 2016-01-24 (×2): via INTRAVENOUS

## 2016-01-22 MED ORDER — CHLORHEXIDINE GLUCONATE 4 % EX LIQD
60.0000 mL | Freq: Once | CUTANEOUS | Status: AC
  Administered 2016-01-22: 4 via TOPICAL

## 2016-01-22 MED ORDER — SODIUM CHLORIDE 0.9 % IV SOLN: INTRAVENOUS | Status: DC

## 2016-01-22 NOTE — Progress Notes (Signed)
[place] at [place] NAME: Jamie Spence    MR#:  [id number]  DATE OF BIRTH:  May 12, 1926  SUBJECTIVE:   Patient has No complaints this morning. She denies dizziness or fatigue.  REVIEW OF SYSTEMS:    Review of Systems  Constitutional: Negative for fever and chills.  HENT: Negative for ear discharge, ear pain, hearing loss, nosebleeds and sore throat.   Eyes: Negative for blurred vision and pain.  Respiratory: Negative for cough, hemoptysis, shortness of breath and wheezing.   Cardiovascular: Negative for chest pain, palpitations and leg swelling.  Gastrointestinal: Negative for nausea, vomiting, abdominal pain, diarrhea and blood in stool.  Genitourinary: Negative for dysuria.  Musculoskeletal: Negative for back pain.  Neurological: Negative for dizziness, tremors, speech change, focal weakness, seizures and headaches.  Endo/Heme/Allergies: Does not bruise/bleed easily.  Psychiatric/Behavioral: Negative for depression, suicidal ideas and hallucinations.    Tolerating Diet:yes      DRUG ALLERGIES:  No Known Allergies  VITALS:  Blood pressure 104/65, pulse 67, temperature 98.4 F (36.9 C), temperature source Oral, resp. rate 21, height 5\' 3"  (1.6 m), weight 57.607 kg (127 lb), SpO2 93 %.  PHYSICAL EXAMINATION:   Physical Exam  Constitutional: She is oriented to person, place, and time and well-developed, well-nourished, and in no distress. No distress.  HENT:  Head: Normocephalic.  Eyes: No scleral icterus.  Neck: Normal range of motion. Neck supple. No JVD present. No tracheal deviation present.  Cardiovascular: Normal rate, regular rhythm and normal heart sounds.  Exam reveals no gallop and no friction rub.   No murmur heard. Pulmonary/Chest: Effort normal and breath sounds normal. No respiratory distress. She has no wheezes. She has no rales. She exhibits no tenderness.  Abdominal: Soft. Bowel sounds are normal. She  exhibits no distension and no mass. There is no tenderness. There is no rebound and no guarding.  Musculoskeletal: Normal range of motion. She exhibits no edema.  Neurological: She is alert and oriented to person, place, and time.  Skin: Skin is warm. No rash noted. No erythema.  Psychiatric: Affect and judgment normal.      LABORATORY PANEL:   CBC  Recent Labs Lab 01/21/16 0439  WBC 8.3  HGB 12.6  HCT 37.7  PLT 221   ------------------------------------------------------------------------------------------------------------------  Chemistries   Recent Labs Lab 01/20/16 0854 01/21/16 0439  NA 137 139  K 4.3 4.1  CL 101 106  CO2 28 26  GLUCOSE 97 99  BUN 16 15  CREATININE 1.05* 0.92  CALCIUM 9.2 8.6*  MG 2.1  --   AST 28 22  ALT 17 14  ALKPHOS 54 43  BILITOT 0.6 0.6   ------------------------------------------------------------------------------------------------------------------  Cardiac Enzymes  Recent Labs Lab 01/20/16 0854 01/20/16 1513 01/20/16 2111  TROPONINI <0.03 <0.03 0.12*   ------------------------------------------------------------------------------------------------------------------  RADIOLOGY:  Dg Chest Port 1 View  01/20/2016  CLINICAL DATA:  The coronary artery disease and peripheral vascular disease. Hypertension. Hyperlipidemia. Second degree heart block. EXAM: PORTABLE CHEST 1 VIEW COMPARISON:  01/20/2016 FINDINGS: The heart size and mediastinal contours are within normal limits. Patient is partially rotated to the right. Both lungs are clear. No evidence of pulmonary infiltrate or pleural effusion. No evidence of pulmonary edema. IMPRESSION: No active disease. Electronically Signed   By: Earle Gell M.D.   On: 01/20/2016 18:37   Dg Chest Port 1 View  01/20/2016  CLINICAL DATA:  Near syncope. EXAM: PORTABLE CHEST 1 VIEW COMPARISON:  01/16/2014 FINDINGS: Prominent interstitial lung  markings appear chronic. There is no focal airspace  disease and no frank pulmonary edema. Retrocardiac density is suggestive for a hiatal hernia. Heart size is within normal limits and stable. Negative for a pneumothorax. IMPRESSION: No acute chest findings. Electronically Signed   By: Markus Daft M.D.   On: 01/20/2016 09:16     ASSESSMENT AND PLAN:   82 year old female with history of hyperlipidemia who presented with symptomatic bradycardia.  1. Acquired heart block intermittent AV nodal block: Plan for pacemaker on Monday. Patient has temporary one until permanent pacemaker is placed.  2. Hyperlipidemia: Continue atorvastatin.  3. Tobacco dependence: Continue nicotine patch.  4. Iron deficiency anemia: Continue ferrous sulfate.     Management plans discussed with the patient and she is in agreement. /dw family   CODE STATUS: full  CRITICAL CARE TOTAL TIME TAKING CARE OF THIS PATIENT: 30 minutes.   Patient has temperature pacemaker in place but needs to remain in CCU for close monitoring  POSSIBLE D/C 3- days, DEPENDING ON CLINICAL CONDITION.   Cheron Pasquarelli M.D on 01/22/2016 at 8:47 AM  Between 7am to 6pm - Pager - [phone] After 6pm go to [email or web address] - password [email or web address] [facility]  Office  [phone]  CC: Primary care physician; Juluis Pitch, MD  Note: This dictation was prepared with Dragon dictation along with smaller phrase technology. Any transcriptional errors that result from this process are unintentional.

## 2016-01-22 NOTE — Progress Notes (Signed)
Report given to [employer].

## 2016-01-22 NOTE — Progress Notes (Signed)
A&Ox4.  Denies pain.  Afib per cardiac monitor rate in 90's.  Temporary pacer in place to right groin with bruising noted and no hematoma.  No respiratory distress.  Tolerating diet. Family visited this evening.

## 2016-01-22 NOTE — Progress Notes (Signed)
[facility]  SUBJECTIVE: feels better. No further spells   Filed Vitals:   01/22/16 0500 01/22/16 0600 01/22/16 0800 01/22/16 1005  BP:  104/65 107/52 105/54  Pulse: 65 67 68 75  Temp:   98.9 F (37.2 C)   TempSrc:   Oral   Resp: 25 21 24 20   Height:      Weight:      SpO2: 93% 93% 93% 96%    Intake/Output Summary (Last 24 hours) at 01/22/16 1113 Last data filed at 01/22/16 0800  Gross per 24 hour  Intake      0 ml  Output    300 ml  Net   -300 ml    LABS: Basic Metabolic Panel:  Recent Labs  01/20/16 0854 01/21/16 0439  NA 137 139  K 4.3 4.1  CL 101 106  CO2 28 26  GLUCOSE 97 99  BUN 16 15  CREATININE 1.05* 0.92  CALCIUM 9.2 8.6*  MG 2.1  --    Liver Function Tests:  Recent Labs  01/20/16 0854 01/21/16 0439  AST 28 22  ALT 17 14  ALKPHOS 54 43  BILITOT 0.6 0.6  PROT 7.2 6.1*  ALBUMIN 4.1 3.3*   No results for input(s): LIPASE, AMYLASE in the last 72 hours. CBC:  Recent Labs  01/20/16 0854 01/21/16 0439  WBC 7.8 8.3  NEUTROABS 4.9  --   HGB 14.0 12.6  HCT 41.0 37.7  MCV 96.5 98.3  PLT 245 221   Cardiac Enzymes:  Recent Labs  01/20/16 0854 01/20/16 1513 01/20/16 2111  TROPONINI <0.03 <0.03 0.12*   BNP: Invalid input(s): POCBNP D-Dimer: No results for input(s): DDIMER in the last 72 hours. Hemoglobin A1C: No results for input(s): HGBA1C in the last 72 hours. Fasting Lipid Panel: No results for input(s): CHOL, HDL, LDLCALC, TRIG, CHOLHDL, LDLDIRECT in the last 72 hours. Thyroid Function Tests:  Recent Labs  01/21/16 0439  TSH 2.218   Anemia Panel: No results for input(s): VITAMINB12, FOLATE, FERRITIN, TIBC, IRON, RETICCTPCT in the last 72 hours.   Physical Exam: Blood pressure 105/54, pulse 75, temperature 98.9 F (37.2 C), temperature source Oral, resp. rate 20, height 5\' 3"  (1.6 m), weight 57.607 kg (127 lb), SpO2 96 %.   Wt Readings from Last 1 Encounters:  01/20/16 57.607 kg  (127 lb)     General appearance: alert and cooperative Resp: clear to auscultation bilaterally Cardio: regular rate and rhythm GI: soft, non-tender; bowel sounds normal; no masses,  no organomegaly Neurologic: Grossly normal  TELEMETRY: Reviewed telemetry pt in sr   ASSESSMENT AND PLAN:  Active Problems:   Acquired heart block-no further pauses.Will need ppm tomorrow for acquired high grade heart block    Teodoro Spray., MD, [facility] 01/22/2016 11:13 AM

## 2016-01-23 ENCOUNTER — Inpatient Hospital Stay: Payer: Medicare Other

## 2016-01-23 ENCOUNTER — Inpatient Hospital Stay: Payer: Medicare Other | Admitting: Anesthesiology

## 2016-01-23 ENCOUNTER — Encounter
Admission: EM | Disposition: A | Discharge status: Home Health | Payer: Self-pay | Source: Home / Self Care | Attending: Internal Medicine

## 2016-01-23 HISTORY — PX: PACEMAKER INSERTION: SHX728

## 2016-01-23 LAB — CBC
HCT: 36.6 % (ref 35.0–47.0)
Hemoglobin: 12.5 g/dL (ref 12.0–16.0)
MCH: 33.9 pg (ref 26.0–34.0)
MCHC: 34.3 g/dL (ref 32.0–36.0)
MCV: 98.9 fL (ref 80.0–100.0)
PLATELETS: 168 10*3/uL (ref 150–440)
RBC: 3.7 MIL/uL — AB (ref 3.80–5.20)
RDW: 14.4 % (ref 11.5–14.5)
WBC: 9.8 10*3/uL (ref 3.6–11.0)

## 2016-01-23 LAB — BASIC METABOLIC PANEL
Anion gap: 6 (ref 5–15)
BUN: 14 mg/dL (ref 6–20)
CO2: 25 mmol/L (ref 22–32)
Calcium: 8.5 mg/dL — ABNORMAL LOW (ref 8.9–10.3)
Chloride: 106 mmol/L (ref 101–111)
Creatinine, Ser: 0.87 mg/dL (ref 0.44–1.00)
GFR calc Af Amer: >60 mL/min (ref >60)
GFR, EST NON AFRICAN AMERICAN: 58 mL/min — AB (ref >60)
GLUCOSE: 113 mg/dL — AB (ref 65–99)
POTASSIUM: 3.7 mmol/L (ref 3.5–5.1)
Sodium: 137 mmol/L (ref 135–145)

## 2016-01-23 SURGERY — INSERTION, CARDIAC PACEMAKER
Anesthesia: Monitor Anesthesia Care | Site: Chest | Laterality: Left | Wound class: Clean

## 2016-01-23 MED ORDER — ACETAMINOPHEN 325 MG PO TABS: 325.0000 mg | ORAL_TABLET | ORAL | Status: DC | PRN

## 2016-01-23 MED ORDER — IOPAMIDOL (ISOVUE-M 200) INJECTION 41%
INTRAMUSCULAR | Status: AC
  Filled 2016-01-23: qty 10

## 2016-01-23 MED ORDER — SODIUM CHLORIDE 0.9 % IJ SOLN
INTRAMUSCULAR | Status: DC | PRN
  Administered 2016-01-23: 4 mL

## 2016-01-23 MED ORDER — HYDROCODONE-ACETAMINOPHEN 7.5-325 MG PO TABS
1.0000 | ORAL_TABLET | Freq: Once | ORAL | Status: DC | PRN
Start: 2016-01-23 — End: 2016-01-23

## 2016-01-23 MED ORDER — PHENYLEPHRINE HCL 10 MG/ML IJ SOLN
INTRAMUSCULAR | Status: DC | PRN
  Administered 2016-01-23 (×2): 100 ug via INTRAVENOUS
  Administered 2016-01-23: 200 ug via INTRAVENOUS
  Administered 2016-01-23: 100 ug via INTRAVENOUS
  Administered 2016-01-23 (×3): 200 ug via INTRAVENOUS
  Administered 2016-01-23: 100 ug via INTRAVENOUS

## 2016-01-23 MED ORDER — LIDOCAINE 1 % OPTIME INJ - NO CHARGE
INTRAMUSCULAR | Status: DC | PRN
  Administered 2016-01-23: 25 mL

## 2016-01-23 MED ORDER — SODIUM CHLORIDE 0.9 % IJ SOLN
INTRAMUSCULAR | Status: AC
  Filled 2016-01-23: qty 50

## 2016-01-23 MED ORDER — ONDANSETRON HCL 4 MG/2ML IJ SOLN
4.0000 mg | Freq: Once | INTRAMUSCULAR | Status: DC | PRN
Start: 2016-01-23 — End: 2016-01-23

## 2016-01-23 MED ORDER — CEFAZOLIN SODIUM 1-5 GM-% IV SOLN
1.0000 g | Freq: Four times a day (QID) | INTRAVENOUS | Status: AC
  Administered 2016-01-23 – 2016-01-24 (×3): 1 g via INTRAVENOUS
  Filled 2016-01-23 (×3): qty 50

## 2016-01-23 MED ORDER — FENTANYL CITRATE (PF) 100 MCG/2ML IJ SOLN
INTRAMUSCULAR | Status: DC | PRN
  Administered 2016-01-23 (×4): 25 ug via INTRAVENOUS

## 2016-01-23 MED ORDER — SODIUM CHLORIDE 0.9 % IR SOLN
Status: DC | PRN
  Administered 2016-01-23: 300 mL

## 2016-01-23 MED ORDER — CLOPIDOGREL BISULFATE 75 MG PO TABS: 75.0000 mg | ORAL_TABLET | Freq: Every day | ORAL | Status: DC

## 2016-01-23 MED ORDER — PROPOFOL 500 MG/50ML IV EMUL
INTRAVENOUS | Status: DC | PRN
  Administered 2016-01-23: 75 ug/kg/min via INTRAVENOUS

## 2016-01-23 MED ORDER — MEPERIDINE HCL 25 MG/ML IJ SOLN: 6.2500 mg | INTRAMUSCULAR | Status: DC | PRN

## 2016-01-23 MED ORDER — ONDANSETRON HCL 4 MG/2ML IJ SOLN: 4.0000 mg | Freq: Four times a day (QID) | INTRAMUSCULAR | Status: DC | PRN

## 2016-01-23 MED ORDER — HYDROMORPHONE HCL 1 MG/ML IJ SOLN: 0.2500 mg | INTRAMUSCULAR | Status: DC | PRN

## 2016-01-23 SURGICAL SUPPLY — 35 items
BAG DECANTER FOR FLEXI CONT (MISCELLANEOUS) ×3 IMPLANT
BRUSH SCRUB 4% CHG (MISCELLANEOUS) ×3 IMPLANT
CABLE SURG 12 DISP A/V CHANNEL (MISCELLANEOUS) ×3 IMPLANT
CANISTER SUCT 1200ML W/VALVE (MISCELLANEOUS) ×3 IMPLANT
CHLORAPREP W/TINT 26ML (MISCELLANEOUS) ×3 IMPLANT
COVER LIGHT HANDLE STERIS (MISCELLANEOUS) ×6 IMPLANT
COVER MAYO STAND STRL (DRAPES) ×3 IMPLANT
COVER PROBE FLX POLY STRL (MISCELLANEOUS) ×3 IMPLANT
DRAPE C-ARM XRAY 36X54 (DRAPES) ×3 IMPLANT
DRESSING TELFA 4X3 1S ST N-ADH (GAUZE/BANDAGES/DRESSINGS) ×3 IMPLANT
DRSG TEGADERM 4X4.75 (GAUZE/BANDAGES/DRESSINGS) ×3 IMPLANT
ELECT REM PT RETURN 9FT ADLT (ELECTROSURGICAL) ×3
ELECTRODE REM PT RTRN 9FT ADLT (ELECTROSURGICAL) ×1 IMPLANT
GLOVE BIO SURGEON STRL SZ7.5 (GLOVE) ×3 IMPLANT
GLOVE BIO SURGEON STRL SZ8 (GLOVE) ×3 IMPLANT
GOWN STRL REUS W/ TWL LRG LVL3 (GOWN DISPOSABLE) ×1 IMPLANT
GOWN STRL REUS W/ TWL XL LVL3 (GOWN DISPOSABLE) ×1 IMPLANT
GOWN STRL REUS W/TWL LRG LVL3 (GOWN DISPOSABLE) ×2
GOWN STRL REUS W/TWL XL LVL3 (GOWN DISPOSABLE) ×2
IMMOBILIZER SHDR MD LX WHT (SOFTGOODS) ×3 IMPLANT
IMMOBILIZER SHDR XL LX WHT (SOFTGOODS) IMPLANT
INTRO PACEMKR SHEATH II 7FR (MISCELLANEOUS) ×3
INTRODUCER PACEMKR SHTH II 7FR (MISCELLANEOUS) ×1 IMPLANT
IV NS 500ML (IV SOLUTION) ×2
IV NS 500ML BAXH (IV SOLUTION) ×1 IMPLANT
KIT RM TURNOVER STRD PROC AR (KITS) ×3 IMPLANT
LABEL OR SOLS (LABEL) ×3 IMPLANT
LEAD CAPSURE NOVUS 5076-52CM (Lead) ×3 IMPLANT
MARKER SKIN DUAL TIP RULER LAB (MISCELLANEOUS) ×3 IMPLANT
PACEMAKER INTRO 9X13 (MISCELLANEOUS) ×3 IMPLANT
PACEMAKER LEAD ATRL (Lead) ×3 IMPLANT
PACK PACE INSERTION (MISCELLANEOUS) ×3 IMPLANT
PAD STATPAD (MISCELLANEOUS) ×3 IMPLANT
PPM ADVISA MRI DR A2DR01 (Pacemaker) ×3 IMPLANT
SUT SILK 0 SH 30 (SUTURE) ×9 IMPLANT

## 2016-01-23 NOTE — Transfer of Care (Signed)
Immediate Anesthesia Transfer of Care Note  Patient: Jamie Spence  Procedure(s) Performed: Procedure(s): INSERTION PACEMAKER (Left)  Patient Location: PACU  Anesthesia Type:MAC  Level of Consciousness: awake, alert  and oriented  Airway & Oxygen Therapy: Patient Spontanous Breathing and Patient connected to face mask oxygen  Post-op Assessment: Report given to RN and Post -op Vital signs reviewed and stable  Post vital signs: Reviewed and stable  Last Vitals:  Filed Vitals:   01/23/16 1200 01/23/16 1359  BP: 122/77 113/61  Pulse: 90 90  Temp:    Resp: 18 22    Last Pain:  Filed Vitals:   01/23/16 1400  PainSc: 0-No pain         Complications: No apparent anesthesia complications

## 2016-01-23 NOTE — Op Note (Signed)
[facility]   01/20/2016 - 01/23/2016                     1:56 PM  PATIENT:  Jamie Spence    PRE-OPERATIVE DIAGNOSIS:  intermitant prolonged pauses  POST-OPERATIVE DIAGNOSIS:  Same  PROCEDURE:  INSERTION PACEMAKER  SURGEON:  Nathanial Arrighi, MD    ANESTHESIA:     PREOPERATIVE INDICATIONS:  Jamie Spence is a  82 y.o. female with a diagnosis of intermitant prolonged pauses who failed conservative measures and elected for surgical management.    The risks benefits and alternatives were discussed with the patient preoperatively including but not limited to the risks of infection, bleeding, cardiopulmonary complications, the need for revision surgery, among others, and the patient was willing to proceed.   OPERATIVE PROCEDURE: The patient was brought to the operating room in fasting state. The left pectoral region was prepped and draped in usual sterile manner. Anesthesia was obtained 1% lidocaine locally. A 6 cm incision was performed a left pectoral region. Pacemaker pocket was generated by electrocautery and blunt dissection. Access was obtained the left subclavian vein by fine-needle aspiration. MRI compatible ventricular and atrial leads were positioned to the right ventricular apical septum and right atrial appendage under fluoroscopic guidance. After proper thresholds were obtained the leads was sutured in place. The pacemaker pocket was irrigated with gentamicin solution. The leads were connected to a MRI compatible dual chamber rate responsive pacemaker generator (Medtronic [id number] (and positioned to the pocket and the pocket was closed with 2-0 and 4-0 Vicryl, respectively. Steri-Strips and a pressure dressing were applied. There were no procedural complications.

## 2016-01-23 NOTE — Progress Notes (Signed)
Patient moved to PACU for surgery.  Report given to OR nurse.  Anesthesiologist talking with patient.  Patient transferred on tele monitor. Alert with no distress noted.

## 2016-01-23 NOTE — Progress Notes (Signed)
Pt tx from PACU. Room air. A fib. Takes meds ok. Pt reports no pain. Dressing to L shoulder no bleeding. A & O. Pt has no further concerns at this time.

## 2016-01-23 NOTE — Anesthesia Preprocedure Evaluation (Addendum)
Anesthesia Evaluation  Patient identified by MRN, date of birth, ID band Patient awake    Reviewed: Allergy & Precautions, NPO status , Patient's Chart, lab work & pertinent test results, reviewed documented beta blocker date and time   Airway Mallampati: III  TM Distance: >3 FB     Dental  (+) Edentulous Upper, Edentulous Lower   Pulmonary Current Smoker,    breath sounds clear to auscultation       Cardiovascular hypertension, + Peripheral Vascular Disease  + dysrhythmias Atrial Fibrillation + Valvular Problems/Murmurs AS  Rhythm:Irregular Rate:Normal     Neuro/Psych negative neurological ROS     GI/Hepatic negative GI ROS, Neg liver ROS,   Endo/Other  negative endocrine ROS  Renal/GU negative Renal ROS     Musculoskeletal   Abdominal   Peds negative pediatric ROS (+)  Hematology   Anesthesia Other Findings   Reproductive/Obstetrics negative OB ROS                            Anesthesia Physical Anesthesia Plan  ASA: IV  Anesthesia Plan: MAC   Post-op Pain Management:    Induction: Intravenous  Airway Management Planned:   Additional Equipment:   Intra-op Plan:   Post-operative Plan:   Informed Consent: I have reviewed the patients History and Physical, chart, labs and discussed the procedure including the risks, benefits and alternatives for the proposed anesthesia with the patient or authorized representative who has indicated his/her understanding and acceptance.     Plan Discussed with:   Anesthesia Plan Comments: (Patient has a temporary pacemaker in place with a back up ventricular rate of 60 bpm Currently a.fib and not paced)        Anesthesia Quick Evaluation

## 2016-01-23 NOTE — Anesthesia Postprocedure Evaluation (Signed)
Anesthesia Post Note  Patient: ROMINA PULIS  Procedure(s) Performed: Procedure(s) (LRB): INSERTION PACEMAKER (Left)  Patient location during evaluation: PACU Anesthesia Type: MAC Level of consciousness: awake and alert Pain management: pain level controlled Vital Signs Assessment: post-procedure vital signs reviewed and stable Respiratory status: spontaneous breathing, nonlabored ventilation, respiratory function stable and patient connected to nasal cannula oxygen Cardiovascular status: stable and blood pressure returned to baseline Anesthetic complications: no    Last Vitals:  Filed Vitals:   01/23/16 1352 01/23/16 1359  BP: 113/61 113/61  Pulse: 84 90  Temp: 36.2 C   Resp: 20 22    Last Pain:  Filed Vitals:   01/23/16 1408  PainSc: 0-No pain                 Shawn Dannenberg

## 2016-01-23 NOTE — Progress Notes (Signed)
[place] [facility] [place] NAME: Jamie Spence    MR#:  [id number]  DATE OF BIRTH:  1926-01-05  SUBJECTIVE:   Patient Doing well this morning. She denies lightheadedness or dizziness. Plan for pacemaker today. She has temporary pacemaker placed.  REVIEW OF SYSTEMS:    Review of Systems  Constitutional: Negative for fever and chills.  HENT: Negative for ear discharge, ear pain, hearing loss, nosebleeds and sore throat.   Eyes: Negative for blurred vision and pain.  Respiratory: Negative for cough, hemoptysis, shortness of breath and wheezing.   Cardiovascular: Negative for chest pain, palpitations and leg swelling.  Gastrointestinal: Negative for nausea, vomiting, abdominal pain, diarrhea and blood in stool.  Genitourinary: Negative for dysuria.  Musculoskeletal: Negative for back pain.  Neurological: Negative for dizziness, tremors, speech change, focal weakness, seizures and headaches.  Endo/Heme/Allergies: Does not bruise/bleed easily.  Psychiatric/Behavioral: Negative for depression, suicidal ideas and hallucinations.    Tolerating Diet:yes      DRUG ALLERGIES:  No Known Allergies  VITALS:  Blood pressure 127/68, pulse 95, temperature 98.3 F (36.8 C), temperature source Oral, resp. rate 23, height 5\' 3"  (1.6 m), weight 57.607 kg (127 lb), SpO2 95 %.  PHYSICAL EXAMINATION:   Physical Exam  Constitutional: She is oriented to person, place, and time and well-developed, well-nourished, and in no distress. No distress.  HENT:  Head: Normocephalic.  Eyes: No scleral icterus.  Neck: Normal range of motion. Neck supple. No JVD present. No tracheal deviation present.  Cardiovascular: Normal rate, regular rhythm and normal heart sounds.  Exam reveals no gallop and no friction rub.   No murmur heard. Pulmonary/Chest: Effort normal and breath sounds normal. No respiratory distress. She has no wheezes. She has no rales. She exhibits no  tenderness.  Abdominal: Soft. Bowel sounds are normal. She exhibits no distension and no mass. There is no tenderness. There is no rebound and no guarding.  Musculoskeletal: Normal range of motion. She exhibits no edema.  Neurological: She is alert and oriented to person, place, and time.  Skin: Skin is warm. No rash noted. No erythema.  Psychiatric: Affect and judgment normal.      LABORATORY PANEL:   CBC  Recent Labs Lab 01/23/16 0416  WBC 9.8  HGB 12.5  HCT 36.6  PLT 168   ------------------------------------------------------------------------------------------------------------------  Chemistries   Recent Labs Lab 01/20/16 0854 01/21/16 0439 01/23/16 0416  NA 137 139 137  K 4.3 4.1 3.7  CL 101 106 106  CO2 28 26 25   GLUCOSE 97 99 113*  BUN 16 15 14   CREATININE 1.05* 0.92 0.87  CALCIUM 9.2 8.6* 8.5*  MG 2.1  --   --   AST 28 22  --   ALT 17 14  --   ALKPHOS 54 43  --   BILITOT 0.6 0.6  --    ------------------------------------------------------------------------------------------------------------------  Cardiac Enzymes  Recent Labs Lab 01/20/16 0854 01/20/16 1513 01/20/16 2111  TROPONINI <0.03 <0.03 0.12*   ------------------------------------------------------------------------------------------------------------------  RADIOLOGY:  No results found.   ASSESSMENT AND PLAN:   82 year old female with history of hyperlipidemia who presented with symptomatic bradycardia.  1. Acquired heart block intermittent AV nodal block: Plan for pacemaker At noon today.  Patient has temporary one until permanent pacemaker is placed.  2. Hyperlipidemia: Continue atorvastatin.  3. Tobacco dependence: Continue nicotine patch.  4. Iron deficiency anemia: Continue ferrous sulfate.     Management plans discussed with the patient and she is in  agreement.   CODE STATUS: full  CRITICAL CARE TOTAL TIME TAKING CARE OF THIS PATIENT: 30 minutes.  Until she  receives pertinent pacemaker she remains in critical condition. POSSIBLE D/C 1-2 days, DEPENDING ON CLINICAL CONDITION.   Jamie Spence M.D on 01/23/2016 at 11:10 AM  Between 7am to 6pm - Pager - [phone] After 6pm go to [email or web address] - password [email or web address] [facility]  Office  [phone]  CC: Primary care physician; Jamie Pitch, MD  Note: This dictation was prepared with Dragon dictation along with smaller phrase technology. Any transcriptional errors that result from this process are unintentional.

## 2016-01-23 NOTE — OR Nursing (Addendum)
Right groin temporary pacemaker wire removed per MD order.

## 2016-01-24 ENCOUNTER — Encounter: Payer: Self-pay | Admitting: Cardiology

## 2016-01-24 LAB — GLUCOSE, CAPILLARY: GLUCOSE-CAPILLARY: 115 mg/dL — AB (ref 65–99)

## 2016-01-24 MED ORDER — SOTALOL HCL 80 MG PO TABS: 40.0000 mg | ORAL_TABLET | Freq: Two times a day (BID) | ORAL | Status: AC

## 2016-01-24 MED ORDER — APIXABAN 2.5 MG PO TABS
2.5000 mg | ORAL_TABLET | Freq: Two times a day (BID) | ORAL | Status: DC
Start: 2016-01-24 — End: 2016-01-24
  Administered 2016-01-24: 2.5 mg via ORAL
  Filled 2016-01-24: qty 1

## 2016-01-24 MED ORDER — SOTALOL HCL 80 MG PO TABS
40.0000 mg | ORAL_TABLET | Freq: Once | ORAL | Status: AC
  Administered 2016-01-24: 40 mg via ORAL
  Filled 2016-01-24: qty 1

## 2016-01-24 MED ORDER — SOTALOL HCL 80 MG PO TABS: 40.0000 mg | ORAL_TABLET | Freq: Two times a day (BID) | ORAL | Status: DC

## 2016-01-24 MED ORDER — NICOTINE 21 MG/24HR TD PT24: 21.0000 mg | MEDICATED_PATCH | Freq: Every day | TRANSDERMAL | Status: AC

## 2016-01-24 MED ORDER — APIXABAN 2.5 MG PO TABS: 2.5000 mg | ORAL_TABLET | Freq: Two times a day (BID) | ORAL | Status: AC

## 2016-01-24 NOTE — Care Management Important Message (Signed)
Important Message  Patient Details  Name: Jamie Spence MRN: [id number] Date of Birth: Jan 16, 1926   Medicare Important Message Given:  Yes    Juliann Pulse A Verba Ainley 01/24/2016, 12:56 PM

## 2016-01-24 NOTE — Progress Notes (Addendum)
[place]  SUBJECTIVE: no further syncope. No pauses. Now in afib with controlled vr. S/P ppm   Filed Vitals:   01/23/16 1430 01/23/16 1510 01/23/16 1947 01/24/16 0455  BP:  110/56 152/64 126/62  Pulse:  90 86 99  Temp: 98.3 F (36.8 C) 98.2 F (36.8 C) 98.3 F (36.8 C) 98.4 F (36.9 C)  TempSrc:  Oral Oral Oral  Resp:  12 16 16   Height:      Weight:      SpO2:  95% 99% 90%    Intake/Output Summary (Last 24 hours) at 01/24/16 0732 Last data filed at 01/23/16 1835  Gross per 24 hour  Intake    918 ml  Output    170 ml  Net    748 ml    LABS: Basic Metabolic Panel:  Recent Labs  01/23/16 0416  NA 137  K 3.7  CL 106  CO2 25  GLUCOSE 113*  BUN 14  CREATININE 0.87  CALCIUM 8.5*   Liver Function Tests: No results for input(s): AST, ALT, ALKPHOS, BILITOT, PROT, ALBUMIN in the last 72 hours. No results for input(s): LIPASE, AMYLASE in the last 72 hours. CBC:  Recent Labs  01/23/16 0416  WBC 9.8  HGB 12.5  HCT 36.6  MCV 98.9  PLT 168   Cardiac Enzymes: No results for input(s): CKTOTAL, CKMB, CKMBINDEX, TROPONINI in the last 72 hours. BNP: Invalid input(s): POCBNP D-Dimer: No results for input(s): DDIMER in the last 72 hours. Hemoglobin A1C: No results for input(s): HGBA1C in the last 72 hours. Fasting Lipid Panel: No results for input(s): CHOL, HDL, LDLCALC, TRIG, CHOLHDL, LDLDIRECT in the last 72 hours. Thyroid Function Tests: No results for input(s): TSH, T4TOTAL, T3FREE, THYROIDAB in the last 72 hours.  Invalid input(s): FREET3 Anemia Panel: No results for input(s): VITAMINB12, FOLATE, FERRITIN, TIBC, IRON, RETICCTPCT in the last 72 hours.   Physical Exam: Blood pressure 126/62, pulse 99, temperature 98.4 F (36.9 C), temperature source Oral, resp. rate 16, height 5\' 3"  (1.6 m), weight 57.607 kg (127 lb), SpO2 90 %.   Wt Readings from Last 1 Encounters:  01/20/16 57.607 kg (127 lb)     General  appearance: alert and cooperative Resp: clear to auscultation bilaterally Cardio: irregularly irregular rhythm GI: soft, non-tender; bowel sounds normal; no masses,  no organomegaly Neurologic: Grossly normal  TELEMETRY: Reviewed telemetry pt in afib with controlled vr:  ASSESSMENT AND PLAN:  Active Problems:   Acquired heart block-pt with prolonged puases requiring temp transvenous pacemaker now with ppm. Has developed afib with controlled vr. CHADS2VASc score is 4. Will discontinue plavix and place on eliquis 2.5 mg bid (pt less than 60 kg and greater than 42 years of age). Will add sotalol 40 mg bid and may discharge after two doses one at 8:30 and onee at 12:00. Continue with other meds and ambulate and discharge if stable with outpatient follow up with Dr. Saralyn Pilar within 1 week.     Teodoro Spray., MD, [facility] 01/24/2016 7:32 AM

## 2016-01-24 NOTE — Evaluation (Addendum)
Physical Therapy Evaluation Patient Details Name: ZADE TURNIER MRN: [id number] DOB: 1926/05/22 Today's Date: 01/24/2016   History of Present Illness  Pt admitted for heart block. Pt is now s/p pacemaker placed on 01/23/16. Pt with complaints of dizziness. Pt with history of HTN, PVD, and CAD.   Clinical Impression  Pt is a pleasant 82 year old female who was admitted for heart block. Pt is now s/p pacemaker placed on 01/23/16. Pt performs bed mobility with mod I, transfers with cga, and ambulation with cga and rw. Pt demonstrates deficits with strength/mobility/endurance/pain. Would benefit from skilled PT to address above deficits and promote optimal return to PLOF. Recommend transition to [place] upon discharge from acute hospitalization.       Follow Up Recommendations Home health PT;Supervision - Intermittent    Equipment Recommendations       Recommendations for Other Services       Precautions / Restrictions Precautions Precautions: Fall Restrictions Weight Bearing Restrictions: No      Mobility  Bed Mobility Overal bed mobility: Modified Independent             General bed mobility comments: used bed railing to assist. Pt guarded on L side, able to sit on R side of bed. Safe technique performed  Transfers Overall transfer level: Needs assistance Equipment used: Rolling walker (2 wheeled) Transfers: Sit to/from Stand Sit to Stand: Min guard         General transfer comment: safe technique performed with pt able to push with R hand from bed  Ambulation/Gait Ambulation/Gait assistance: Min assist Ambulation Distance (Feet): 8 Feet Assistive device: 1 person hand held assist Gait Pattern/deviations: Step-to pattern     General Gait Details: ambulated to [facility] with L HHA. Pt with difficulty with weight bearing on L LE secondary to pain in heel. Pt also unsteady and reaching for furniture. Further ambulation noted in ther-ex.  Stairs             Wheelchair Mobility    Modified Rankin (Stroke Patients Only)       Balance Overall balance assessment: Needs assistance Sitting-balance support: No upper extremity supported;Feet supported Sitting balance-Leahy Scale: Normal     Standing balance support: Bilateral upper extremity supported Standing balance-Leahy Scale: Good                               Pertinent Vitals/Pain Pain Assessment: Faces Faces Pain Scale: Hurts even more Pain Location: L foot, able to heel weight bear Pain Descriptors / Indicators: Constant;Aching;Dull Pain Intervention(s): Limited activity within patient's tolerance    Home Living Family/patient expects to be discharged to:: Private residence Living Arrangements: Other relatives (grand daughter checks on her) Available Help at Discharge: Family Type of Home: House Home Access: Stairs to enter Entrance Stairs-Rails: Can reach both Entrance Stairs-Number of Steps: has 7-8 stairs to enter front door, however also has level entry Home Layout: Able to live on main level with bedroom/bathroom Home Equipment: Walker - 2 wheels;Cane - quad      Prior Function Level of Independence: Independent               Hand Dominance        Extremity/Trunk Assessment   Upper Extremity Assessment: Generalized weakness (L UE not tested; R UE grossly 4/5)           Lower Extremity Assessment: Generalized weakness (B LE grossly 4/5)  Communication   Communication: No difficulties  Cognition Arousal/Alertness: Awake/alert Behavior During Therapy: WFL for tasks assessed/performed Overall Cognitive Status: Within Functional Limits for tasks assessed                      General Comments      Exercises Other Exercises Other Exercises: ambulated using RW with cues for limited WB on L LE. Pt with improved balance and ambulated approx 100' with cga. Pt educated to continue use of RW in home environment. Step to  gait pattern noted secondary to L LE pain. Other Exercises: Pt able to ambulate to [facility] and needs cga for balance while standing to perform hygiene. Safe technique performed       Assessment/Plan    PT Assessment Patient needs continued PT services  PT Diagnosis Difficulty walking;Generalized weakness   PT Problem List Decreased strength;Decreased balance;Decreased mobility;Pain  PT Treatment Interventions DME instruction;Gait training;Therapeutic exercise   PT Goals (Current goals can be found in the Care Plan section) Acute Rehab PT Goals Patient Stated Goal: to go home PT Goal Formulation: With patient Time For Goal Achievement: 02/07/16 Potential to Achieve Goals: Good    Frequency Min 2X/week   Barriers to discharge        Co-evaluation               End of Session Equipment Utilized During Treatment: Gait belt Activity Tolerance: Patient tolerated treatment well Patient left: in bed;with bed alarm set Nurse Communication: Mobility status         Time: [id number] PT Time Calculation (min) (ACUTE ONLY): 27 min   Charges:   PT Evaluation $PT Eval Moderate Complexity: 1 Procedure PT Treatments $Gait Training: 8-22 mins   PT G Codes:        Shantinique Picazo 18-Feb-2016, 11:30 AM  Greggory Stallion, PT, DPT [phone]

## 2016-01-24 NOTE — Care Management Note (Signed)
Case Management Note  Patient Details  Name: Jamie Spence MRN: [id number] Date of Birth: [birth year]  Subjective/Objective:  PT recommending home PT. Patient agreeable with no agency preference. Referral to [facility] for PT. Free 30 day coupon given for Eliquis.  She lives with her grand daughter in an apartment basement. Uses a walker at times. When well she goes to exercise classes 2 x per week.                  Action/Plan: [facility] for PT.  Expected Discharge Date:   01/24/2016               Expected Discharge Plan:  [place]  In-House Referral:     Discharge planning Services  CM Consult  Post Acute Care Choice:  Home Health Choice offered to:  Patient  DME Arranged:    DME Agency:     HH Arranged:  PT [place]:  [place]  Status of Service:  Completed, signed off  Medicare Important Message Given:    Date Medicare IM Given:    Medicare IM give by:    Date Additional Medicare IM Given:    Additional Medicare Important Message give by:     If discussed at [place] of Stay Meetings, dates discussed:    Additional Comments:  Jolly Mango, RN 01/24/2016, 11:59 AM

## 2016-01-24 NOTE — Progress Notes (Signed)
Counseled patient on Apixaban.  Chinita Greenland PharmD Clinical Pharmacist 01/24/2016

## 2016-01-24 NOTE — Discharge Instructions (Signed)
Information on my medicine - ELIQUIS (apixaban)  This medication education was reviewed with me or my healthcare representative as part of my discharge preparation.  The pharmacist that spoke with me during my hospital stay was:  Ahonesty Woodfin A, [place]  Why was Eliquis prescribed for you? Eliquis was prescribed for you to reduce the risk of forming blood clots that can cause a stroke if you have a medical condition called atrial fibrillation (a type of irregular heartbeat) OR to reduce the risk of a blood clots forming after orthopedic surgery.  What do You need to know about Eliquis ? Take your Eliquis TWICE DAILY - one tablet in the morning and one tablet in the evening with or without food.  It would be best to take the doses about the same time each day.  If you have difficulty swallowing the tablet whole please discuss with your pharmacist how to take the medication safely.  Take Eliquis exactly as prescribed by your doctor and DO NOT stop taking Eliquis without talking to the doctor who prescribed the medication.  Stopping may increase your risk of developing a new clot or stroke.  Refill your prescription before you run out.  After discharge, you should have regular check-up appointments with your healthcare provider that is prescribing your Eliquis.  In the future your dose may need to be changed if your kidney function or weight changes by a significant amount or as you get older.  What do you do if you miss a dose? If you miss a dose, take it as soon as you remember on the same day and resume taking twice daily.  Do not take more than one dose of ELIQUIS at the same time.  Important Safety Information A possible side effect of Eliquis is bleeding. You should call your healthcare provider right away if you experience any of the following: ? Bleeding from an injury or your nose that does not stop. ? Unusual colored urine (red or dark brown) or unusual colored stools (red or  black). ? Unusual bruising for unknown reasons. ? A serious fall or if you hit your head (even if there is no bleeding).  Some medicines may interact with Eliquis and might increase your risk of bleeding or clotting while on Eliquis. To help avoid this, consult your healthcare provider or pharmacist prior to using any new prescription or non-prescription medications, including herbals, vitamins, non-steroidal anti-inflammatory drugs (NSAIDs) and supplements.  This website has more information on Eliquis (apixaban): [email or web address].

## 2016-01-24 NOTE — Discharge Summary (Addendum)
[facility] NAME: Jamie Spence    MR#:  [id number]  DATE OF BIRTH:  [birth year]  DATE OF ADMISSION:  01/20/2016 ADMITTING PHYSICIAN: Nicholes Mango, MD  DATE OF DISCHARGE: 01/24/2016  PRIMARY CARE PHYSICIAN: Juluis Pitch, MD    ADMISSION DIAGNOSIS:  Acquired heart block [I45.9]  DISCHARGE DIAGNOSIS:  Active Problems:   Acquired heart block   SECONDARY DIAGNOSIS:   Past Medical History  Diagnosis Date  . Hypertension   . Peripheral vascular disease ([place])   . Carotid artery disease ([place])   . Osteopenia   . Hyperlipemia   . Cancer [facility])     uterine    HOSPITAL COURSE:  82 year old female with history of hyperlipidemia who presented with symptomatic bradycardia.  1. Acquired heart block intermittent AV nodal block: She initially had temporary until permanent pacemaker was placed on 01/23/2016. She will follow up with Dr Lorinda Creed in 1 week and will keep steri strips in place until then.   2. Hyperlipidemia: Continue atorvastatin.  3. Tobacco dependence: Continue nicotine patch.  4. Iron deficiency anemia: Continue ferrous sulfate  5. New onset Atrial Fibrillation: She has developed Atrial fibrillation with controlled heart rate. CHADS2VASc score is 4.  Plavix and aspirin were discontinued and she was started on eliquis 2.5 mg bid (pt less than 60 kg and greater than 44 years of age) as well as sotalol 40 mg bid.   DISCHARGE CONDITIONS AND DIET:   Patient stable for discharge on a heart healthy diet  CONSULTS OBTAINED:  Treatment Team:  Teodoro Spray, MD  DRUG ALLERGIES:  No Known Allergies  DISCHARGE MEDICATIONS:   Current Discharge Medication List    START taking these medications   Details  apixaban (ELIQUIS) 2.5 MG TABS tablet Take 1 tablet (2.5 mg total) by mouth 2 (two) times daily. Qty: 60 tablet, Refills: 0    nicotine (NICODERM CQ - DOSED IN MG/24 HOURS) 21 mg/24hr patch Place 1 patch (21 mg  total) onto the skin daily. Qty: 28 patch, Refills: 0    sotalol (BETAPACE) 80 MG tablet Take 0.5 tablets (40 mg total) by mouth 2 (two) times daily. Qty: 30 tablet, Refills: 0      CONTINUE these medications which have NOT CHANGED   Details  atorvastatin (LIPITOR) 10 MG tablet Take 10 mg by mouth daily.    baclofen (LIORESAL) 10 MG tablet Take 10 mg by mouth 3 (three) times daily.    calcium gluconate 500 MG tablet Take 1 tablet by mouth 3 (three) times daily.    cholecalciferol (VITAMIN D) 1000 UNITS tablet Take 5,000 Units by mouth daily.     docusate sodium (COLACE) 100 MG capsule Take 100 mg by mouth 2 (two) times daily.    ferrous sulfate (FER-IN-SOL) 75 (15 FE) MG/ML SOLN Take 65 mg by mouth.    magnesium oxide (MAG-OX) 400 MG tablet Take 400 mg by mouth daily.      STOP taking these medications     aspirin EC 81 MG tablet      clopidogrel (PLAVIX) 75 MG tablet               Today   CHIEF COMPLAINT:   Doing well this morning.   VITAL SIGNS:  Blood pressure 150/77, pulse 91, temperature 98.4 F (36.9 C), temperature source Oral, resp. rate 18, height 5\' 3"  (1.6 m), weight 57.607 kg (127 lb), SpO2 98 %.   REVIEW OF SYSTEMS:  Review  of Systems  Constitutional: Negative for fever, chills and malaise/fatigue.  HENT: Negative for ear discharge, ear pain, hearing loss, nosebleeds and sore throat.   Eyes: Negative for blurred vision and pain.  Respiratory: Negative for cough, hemoptysis, shortness of breath and wheezing.   Cardiovascular: Negative for chest pain, palpitations and leg swelling.  Gastrointestinal: Negative for nausea, vomiting, abdominal pain, diarrhea and blood in stool.  Genitourinary: Negative for dysuria.  Musculoskeletal: Negative for back pain.  Neurological: Negative for dizziness, tremors, speech change, focal weakness, seizures and headaches.  Endo/Heme/Allergies: Does not bruise/bleed easily.  Psychiatric/Behavioral: Negative for  depression, suicidal ideas and hallucinations.     PHYSICAL EXAMINATION:  GENERAL:  82 y.o.-year-old patient lying in the bed with no acute distress.  NECK:  Supple, no jugular venous distention. No thyroid enlargement, no tenderness.  LUNGS: Normal breath sounds bilaterally, no wheezing, rales,rhonchi  No use of accessory muscles of respiration.  CARDIOVASCULAR: irr, irr No murmurs, rubs, or gallops. Pacemaker placed with Steri-Strips ABDOMEN: Soft, non-tender, non-distended. Bowel sounds present. No organomegaly or mass.  EXTREMITIES: No pedal edema, cyanosis, or clubbing.  PSYCHIATRIC: The patient is alert and oriented x 3.  SKIN: No obvious rash, lesion, or ulcer.   DATA REVIEW:   CBC  Recent Labs Lab 01/23/16 0416  WBC 9.8  HGB 12.5  HCT 36.6  PLT 168    Chemistries   Recent Labs Lab 01/20/16 0854 01/21/16 0439 01/23/16 0416  NA 137 139 137  K 4.3 4.1 3.7  CL 101 106 106  CO2 28 26 25   GLUCOSE 97 99 113*  BUN 16 15 14   CREATININE 1.05* 0.92 0.87  CALCIUM 9.2 8.6* 8.5*  MG 2.1  --   --   AST 28 22  --   ALT 17 14  --   ALKPHOS 54 43  --   BILITOT 0.6 0.6  --     Cardiac Enzymes  Recent Labs Lab 01/20/16 0854 01/20/16 1513 01/20/16 2111  TROPONINI <0.03 <0.03 0.12*    Microbiology Results  @[email or web address] @  RADIOLOGY:  X-ray Chest Pa Or Ap  01/23/2016  CLINICAL DATA:  Status post pacemaker insertion. EXAM: CHEST 1 VIEW COMPARISON:  01/20/2016 FINDINGS: Left anterior chest wall sequential pacemaker has its leads projecting over the right atrium right ventricle. No pneumothorax. Lungs show heterogeneous areas of irregular interstitial thickening, stable. No convincing pneumonia or pulmonary edema. IMPRESSION: 1. New left anterior chest wall sequential pacemaker. Leads well positioned on this single AP view. No pneumothorax. No acute findings. Electronically Signed   By: Lajean Manes M.D.   On: 01/23/2016 14:35   Dg C-arm 1-60 Min-no Report  01/23/2016   CLINICAL DATA: insertion pacemaker C-ARM 1-60 MINUTES Fluoroscopy was utilized by the requesting physician.  No radiographic interpretation.      Management plans discussed with the patient and she is in agreement. Stable for discharge Home with [facility]  Patient should follow up with Dr Lorinda Creed 1 week  CODE STATUS:     Code Status Orders        Start     Ordered   01/20/16 1513  Full code   Continuous     01/20/16 1512    Code Status History    Date Active Date Inactive Code Status Order ID Comments User Context   03/09/2015 12:11 PM 03/10/2015  4:18 PM Full Code [id number]  Algernon Huxley, MD Inpatient    Advance Directive Documentation        Most Recent  Value   Type of Advance Directive  Healthcare Power of Attorney, Living will   Pre-existing out of facility DNR order (yellow form or pink MOST form)     "MOST" Form in Place?        TOTAL TIME TAKING CARE OF THIS PATIENT: 35 minutes.    Note: This dictation was prepared with Dragon dictation along with smaller phrase technology. Any transcriptional errors that result from this process are unintentional.  Geraldo Haris M.D on 01/24/2016 at 11:37 AM  Between 7am to 6pm - Pager - [phone] After 6pm go to [email or web address] - password [email or web address]  Office  [phone]  CC: Primary care physician; Juluis Pitch, MD

## 2016-01-24 NOTE — Progress Notes (Signed)
Patient is discharge home in a stable condition, with steri strip intact and dry to left upper chest wall pt's instruction given to both pt's and grand-daughter on site care, summary and f/u given verbalized understanding , left with grand-daughter

## 2016-11-08 IMAGING — DX DG CHEST 1V
1 series · 1 of 1 positions shown · non-contrast
Comparison: 01/20/2016

CLINICAL DATA: Status post pacemaker insertion.

EXAM:
CHEST 1 VIEW

[chest ap]
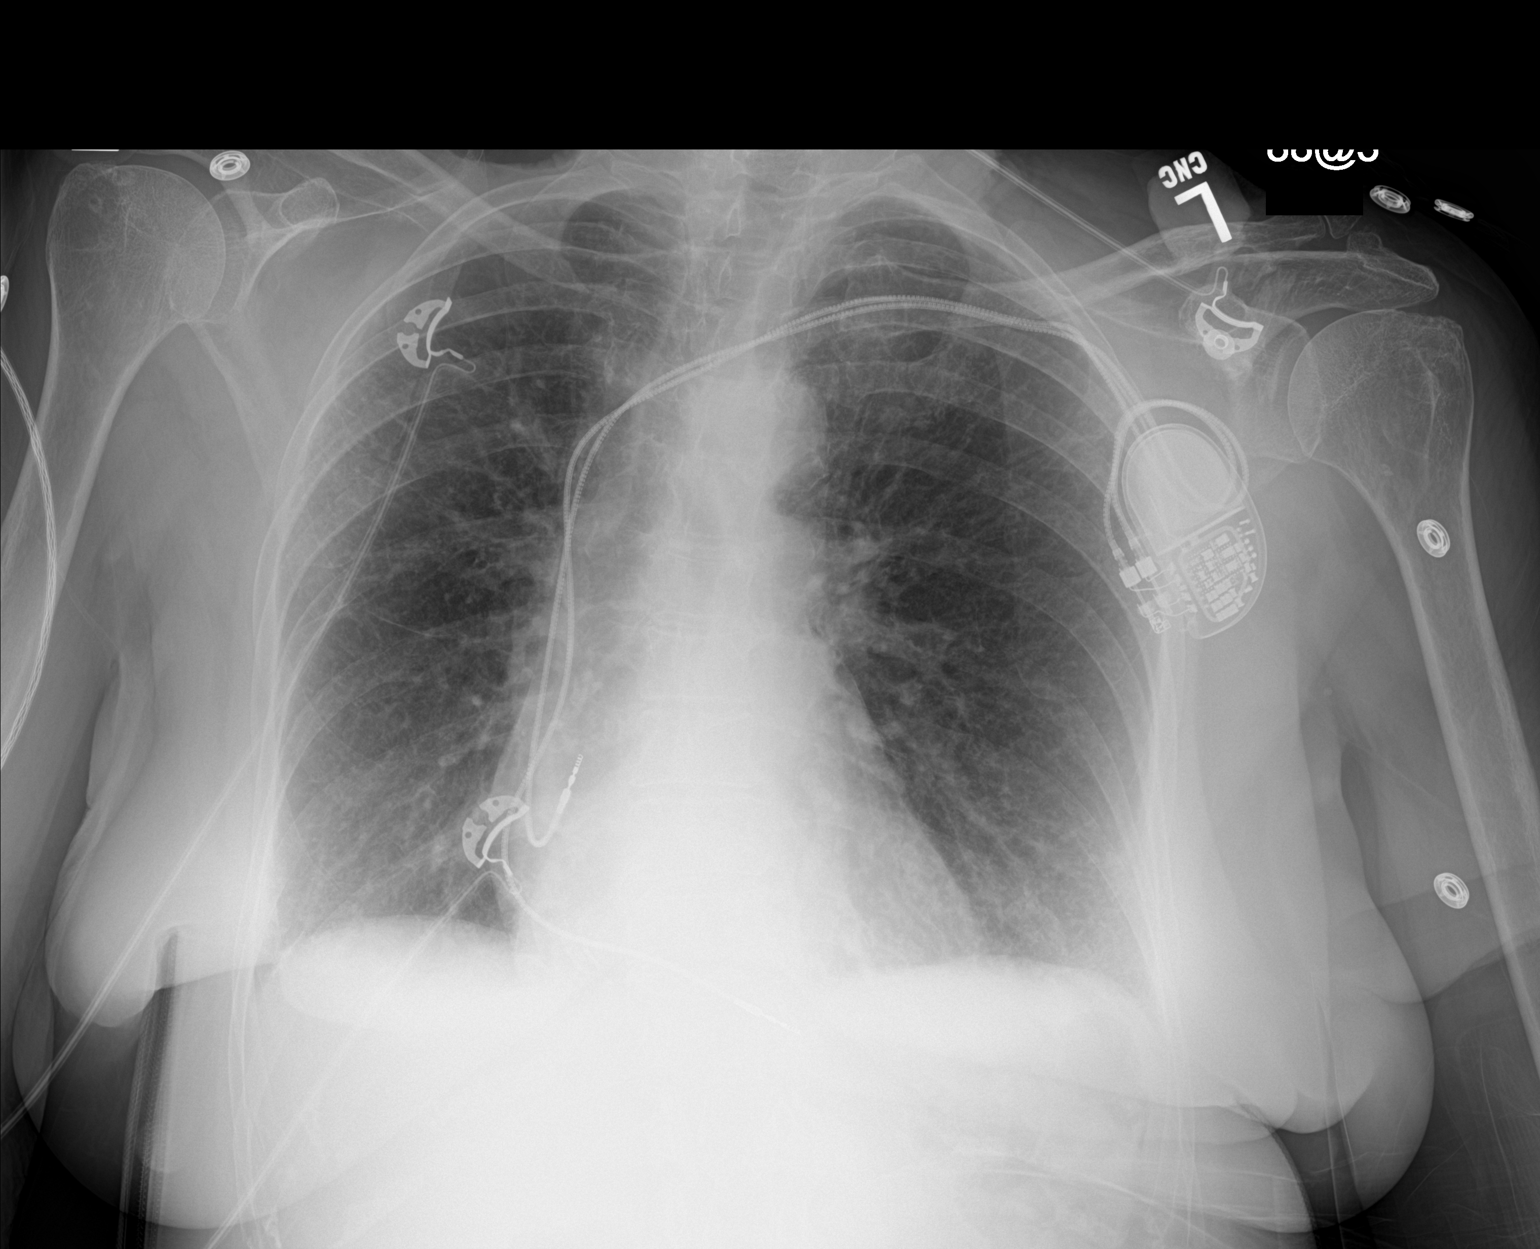

[1 of 1 positions shown; findings below may reference images not displayed]

FINDINGS: Left anterior chest wall sequential pacemaker has its leads
projecting over the right atrium right ventricle.

No pneumothorax.

Lungs show heterogeneous areas of irregular interstitial thickening,
stable. No convincing pneumonia or pulmonary edema.
IMPRESSION: 1. New left anterior chest wall sequential pacemaker. Leads well
positioned on this single AP view. No pneumothorax. No acute
findings.

## 2016-11-26 ENCOUNTER — Inpatient Hospital Stay
Admission: EM | Admit: 2016-11-26 | Discharge: 2016-11-28 | DRG: 392 | Disposition: A | Discharge status: Home / Self Care | Payer: Medicare Other | Attending: Internal Medicine | Admitting: Internal Medicine

## 2016-11-26 ENCOUNTER — Emergency Department: Payer: Medicare Other

## 2016-11-26 ENCOUNTER — Encounter: Payer: Self-pay | Admitting: Radiology

## 2016-11-26 DIAGNOSIS — E876 Hypokalemia: Secondary | ICD-10-CM | POA: Diagnosis present

## 2016-11-26 DIAGNOSIS — R54 Age-related physical debility: Secondary | ICD-10-CM | POA: Diagnosis present

## 2016-11-26 DIAGNOSIS — Z79899 Other long term (current) drug therapy: Secondary | ICD-10-CM

## 2016-11-26 DIAGNOSIS — Z8542 Personal history of malignant neoplasm of other parts of uterus: Secondary | ICD-10-CM | POA: Diagnosis not present

## 2016-11-26 DIAGNOSIS — I10 Essential (primary) hypertension: Secondary | ICD-10-CM | POA: Diagnosis present

## 2016-11-26 DIAGNOSIS — Z950 Presence of cardiac pacemaker: Secondary | ICD-10-CM | POA: Diagnosis not present

## 2016-11-26 DIAGNOSIS — E785 Hyperlipidemia, unspecified: Secondary | ICD-10-CM | POA: Diagnosis present

## 2016-11-26 DIAGNOSIS — K529 Noninfective gastroenteritis and colitis, unspecified: Secondary | ICD-10-CM | POA: Diagnosis present

## 2016-11-26 DIAGNOSIS — I739 Peripheral vascular disease, unspecified: Secondary | ICD-10-CM | POA: Diagnosis present

## 2016-11-26 DIAGNOSIS — R6889 Other general symptoms and signs: Secondary | ICD-10-CM

## 2016-11-26 DIAGNOSIS — E860 Dehydration: Secondary | ICD-10-CM | POA: Diagnosis present

## 2016-11-26 DIAGNOSIS — R112 Nausea with vomiting, unspecified: Secondary | ICD-10-CM

## 2016-11-26 DIAGNOSIS — M8580 Other specified disorders of bone density and structure, unspecified site: Secondary | ICD-10-CM | POA: Diagnosis present

## 2016-11-26 DIAGNOSIS — I714 Abdominal aortic aneurysm, without rupture: Secondary | ICD-10-CM | POA: Diagnosis present

## 2016-11-26 DIAGNOSIS — Z7901 Long term (current) use of anticoagulants: Secondary | ICD-10-CM

## 2016-11-26 DIAGNOSIS — A084 Viral intestinal infection, unspecified: Secondary | ICD-10-CM | POA: Diagnosis present

## 2016-11-26 DIAGNOSIS — F17210 Nicotine dependence, cigarettes, uncomplicated: Secondary | ICD-10-CM | POA: Diagnosis present

## 2016-11-26 DIAGNOSIS — R197 Diarrhea, unspecified: Secondary | ICD-10-CM

## 2016-11-26 LAB — COMPREHENSIVE METABOLIC PANEL
ALBUMIN: 3.8 g/dL (ref 3.5–5.0)
ALK PHOS: 48 U/L (ref 38–126)
ALT: 33 U/L (ref 14–54)
AST: 52 U/L — AB (ref 15–41)
Anion gap: 8 (ref 5–15)
BUN: 17 mg/dL (ref 6–20)
CALCIUM: 8.8 mg/dL — AB (ref 8.9–10.3)
CHLORIDE: 102 mmol/L (ref 101–111)
CO2: 25 mmol/L (ref 22–32)
CREATININE: 0.98 mg/dL (ref 0.44–1.00)
GFR calc non Af Amer: 50 mL/min — ABNORMAL LOW (ref >60)
GFR, EST AFRICAN AMERICAN: 58 mL/min — AB (ref >60)
GLUCOSE: 173 mg/dL — AB (ref 65–99)
Potassium: 3.6 mmol/L (ref 3.5–5.1)
SODIUM: 135 mmol/L (ref 135–145)
Total Bilirubin: 0.7 mg/dL (ref 0.3–1.2)
Total Protein: 7.1 g/dL (ref 6.5–8.1)

## 2016-11-26 LAB — DIFFERENTIAL
BASOS PCT: 1 %
Basophils Absolute: 0 10*3/uL (ref 0–0.1)
EOS ABS: 0 10*3/uL (ref 0–0.7)
Eosinophils Relative: 0 %
Lymphocytes Relative: 5 %
Lymphs Abs: 0.5 10*3/uL — ABNORMAL LOW (ref 1.0–3.6)
MONOS PCT: 1 %
Monocytes Absolute: 0.1 10*3/uL — ABNORMAL LOW (ref 0.2–0.9)
Neutro Abs: 9.4 10*3/uL — ABNORMAL HIGH (ref 1.4–6.5)
Neutrophils Relative %: 93 %

## 2016-11-26 LAB — CBC
HCT: 40.7 % (ref 35.0–47.0)
Hemoglobin: 14.1 g/dL (ref 12.0–16.0)
MCH: 33.9 pg (ref 26.0–34.0)
MCHC: 34.6 g/dL (ref 32.0–36.0)
MCV: 98.1 fL (ref 80.0–100.0)
PLATELETS: 211 10*3/uL (ref 150–440)
RBC: 4.15 MIL/uL (ref 3.80–5.20)
RDW: 14.4 % (ref 11.5–14.5)
WBC: 10.1 10*3/uL (ref 3.6–11.0)

## 2016-11-26 LAB — LACTIC ACID, PLASMA: LACTIC ACID, VENOUS: 1.7 mmol/L (ref 0.5–1.9)

## 2016-11-26 LAB — LIPASE, BLOOD: LIPASE: 17 U/L (ref 11–51)

## 2016-11-26 MED ORDER — POTASSIUM CHLORIDE IN NACL 20-0.9 MEQ/L-% IV SOLN
INTRAVENOUS | Status: DC
  Administered 2016-11-26 – 2016-11-27 (×2): via INTRAVENOUS
  Filled 2016-11-26 (×4): qty 1000

## 2016-11-26 MED ORDER — IPRATROPIUM-ALBUTEROL 0.5-2.5 (3) MG/3ML IN SOLN
3.0000 mL | Freq: Once | RESPIRATORY_TRACT | Status: AC
  Administered 2016-11-26: 3 mL via RESPIRATORY_TRACT

## 2016-11-26 MED ORDER — ONDANSETRON HCL 4 MG PO TABS: 4.0000 mg | ORAL_TABLET | Freq: Four times a day (QID) | ORAL | Status: DC | PRN

## 2016-11-26 MED ORDER — OSELTAMIVIR PHOSPHATE 75 MG PO CAPS
75.0000 mg | ORAL_CAPSULE | Freq: Once | ORAL | Status: AC
  Administered 2016-11-26: 75 mg via ORAL

## 2016-11-26 MED ORDER — OSELTAMIVIR PHOSPHATE 75 MG PO CAPS
ORAL_CAPSULE | ORAL | Status: AC
  Administered 2016-11-26: 75 mg via ORAL
  Filled 2016-11-26: qty 1

## 2016-11-26 MED ORDER — IPRATROPIUM-ALBUTEROL 0.5-2.5 (3) MG/3ML IN SOLN
RESPIRATORY_TRACT | Status: AC
  Administered 2016-11-26: 3 mL via RESPIRATORY_TRACT
  Filled 2016-11-26: qty 3

## 2016-11-26 MED ORDER — ONDANSETRON HCL 4 MG/2ML IJ SOLN
4.0000 mg | Freq: Once | INTRAMUSCULAR | Status: AC | PRN
  Administered 2016-11-26: 4 mg via INTRAVENOUS
  Filled 2016-11-26: qty 2

## 2016-11-26 MED ORDER — ACETAMINOPHEN 325 MG PO TABS: 650.0000 mg | ORAL_TABLET | Freq: Four times a day (QID) | ORAL | Status: DC | PRN

## 2016-11-26 MED ORDER — SODIUM CHLORIDE 0.9 % IV BOLUS (SEPSIS)
250.0000 mL | Freq: Once | INTRAVENOUS | Status: AC
  Administered 2016-11-26: 250 mL via INTRAVENOUS

## 2016-11-26 MED ORDER — IOPAMIDOL (ISOVUE-300) INJECTION 61%
100.0000 mL | Freq: Once | INTRAVENOUS | Status: AC | PRN
  Administered 2016-11-26: 100 mL via INTRAVENOUS

## 2016-11-26 MED ORDER — SODIUM CHLORIDE 0.9 % IV BOLUS (SEPSIS)
1000.0000 mL | Freq: Once | INTRAVENOUS | Status: AC
  Administered 2016-11-26: 1000 mL via INTRAVENOUS

## 2016-11-26 MED ORDER — APIXABAN 2.5 MG PO TABS
2.5000 mg | ORAL_TABLET | Freq: Two times a day (BID) | ORAL | Status: DC
  Administered 2016-11-26 – 2016-11-28 (×5): 2.5 mg via ORAL
  Filled 2016-11-26 (×6): qty 1

## 2016-11-26 MED ORDER — MORPHINE SULFATE (PF) 4 MG/ML IV SOLN
INTRAVENOUS | Status: AC
  Administered 2016-11-26: 2 mg
  Filled 2016-11-26: qty 1

## 2016-11-26 MED ORDER — ATORVASTATIN CALCIUM 10 MG PO TABS
10.0000 mg | ORAL_TABLET | Freq: Every day | ORAL | Status: DC
  Administered 2016-11-26 – 2016-11-28 (×3): 10 mg via ORAL
  Filled 2016-11-26 (×4): qty 1

## 2016-11-26 MED ORDER — SOTALOL HCL 80 MG PO TABS
40.0000 mg | ORAL_TABLET | Freq: Two times a day (BID) | ORAL | Status: DC
  Administered 2016-11-26 – 2016-11-28 (×4): 40 mg via ORAL
  Filled 2016-11-26: qty 2
  Filled 2016-11-26: qty 0.5
  Filled 2016-11-26: qty 1
  Filled 2016-11-26: qty 0.5
  Filled 2016-11-26 (×3): qty 1

## 2016-11-26 MED ORDER — MORPHINE SULFATE (PF) 2 MG/ML IV SOLN: 2.0000 mg | INTRAVENOUS | Status: DC | PRN

## 2016-11-26 MED ORDER — ONDANSETRON HCL 4 MG/2ML IJ SOLN
4.0000 mg | Freq: Four times a day (QID) | INTRAMUSCULAR | Status: DC | PRN
  Administered 2016-11-26 – 2016-11-27 (×2): 4 mg via INTRAVENOUS
  Filled 2016-11-26 (×2): qty 2

## 2016-11-26 MED ORDER — ACETAMINOPHEN 650 MG RE SUPP: 650.0000 mg | Freq: Four times a day (QID) | RECTAL | Status: DC | PRN

## 2016-11-26 MED ORDER — SODIUM CHLORIDE 0.9 % IV BOLUS (SEPSIS)
500.0000 mL | Freq: Once | INTRAVENOUS | Status: AC
  Administered 2016-11-26: 500 mL via INTRAVENOUS

## 2016-11-26 NOTE — ED Notes (Signed)
Patient transported to X-ray at this time 

## 2016-11-26 NOTE — H&P (Addendum)
[facility] [place] NAME: Jamie Spence    MR#:  [id number]  DATE OF BIRTH:  1926-08-20  DATE OF ADMISSION:  11/26/2016  PRIMARY CARE PHYSICIAN: Juluis Pitch, MD   REQUESTING/REFERRING PHYSICIAN:   CHIEF COMPLAINT:   Chief Complaint  Patient presents with  . Emesis  . Diarrhea  . Abdominal Pain    HISTORY OF PRESENT ILLNESS: Jamie Spence  is a 83 y.o. female with a known history of Uterine cancer, carotid artery disease, hypertension, hyperlipidemia, peripheral vascular disease on oral liquids for antibiotic coagulation patient presented to the emergency room with abdominal pain, nausea vomiting and diarrhea. Patient's symptoms started since yesterday morning 2 AM at home. She had multiple episodes of vomiting. Vomitus contained food and water. She is not able to tolerate oral feeds. Patient also has abdominal discomfort which is aching in nature around the umbilicus 6 out of 10 scale of 1-10. She had watery diarrhea. Patient's great-grandson had flu couple of weeks ago. No complaints of any runny nose, headache, blurry vision and cough. She was worked up with CT scan of the abdomen in the emergency room which showed a enteritis and early ileus. No evidence of obstruction noted. No complaints of chest pain, shortness of breath. Incidental a CT scan of the abdomen showed infrarenal abdominal aortic aneurysm 3.6 cm in size. Hospitalist service was consulted for further care of the patient.  PAST MEDICAL HISTORY:   Past Medical History:  Diagnosis Date  . Cancer ([place])    uterine  . Carotid artery disease ([place])   . Hyperlipemia   . Hypertension   . Osteopenia   . Peripheral vascular disease ([place])     PAST SURGICAL HISTORY: Past Surgical History:  Procedure Laterality Date  . PACEMAKER INSERTION Left 01/23/2016   Procedure: INSERTION PACEMAKER;  Surgeon: Isaias Cowman, MD;  Location: [facility] ORS;  Service: Cardiovascular;   Laterality: Left;  . PERIPHERAL VASCULAR CATHETERIZATION Left 03/09/2015   Procedure: Carotid PTA/Stent Intervention;  Surgeon: [name], MD;  Location: [facility];  Service: Cardiovascular;  Laterality: Left;  . PERIPHERAL VASCULAR CATHETERIZATION  03/09/2015   Procedure: Carotid Angiography;  Surgeon: [name], MD;  Location: [facility];  Service: Cardiovascular;;    SOCIAL HISTORY:  Social History  Substance Use Topics  . Smoking status: Current Every Day Smoker    Packs/day: 1.00    Years: 60.00  . Smokeless tobacco: Never Used  . Alcohol use No    FAMILY HISTORY: No family history on file.  Mother and father deceased Patient lives in [place] with family Still active, drives car and independent in activities of daily living.  DRUG ALLERGIES: No Known Allergies  REVIEW OF SYSTEMS:   CONSTITUTIONAL: No fever, has weakness.  EYES: No blurred or double vision.  EARS, NOSE, AND THROAT: No tinnitus or ear pain.  RESPIRATORY: No cough, shortness of breath, wheezing or hemoptysis.  CARDIOVASCULAR: No chest pain, orthopnea, edema.  GASTROINTESTINAL: Has nausea, vomiting, diarrhea or abdominal pain.  GENITOURINARY: No dysuria, hematuria.  ENDOCRINE: No polyuria, nocturia,  HEMATOLOGY: No anemia, easy bruising or bleeding SKIN: No rash or lesion. MUSCULOSKELETAL: No joint pain or arthritis.   NEUROLOGIC: No tingling, numbness, weakness.  PSYCHIATRY: No anxiety or depression.   MEDICATIONS AT HOME:  Prior to Admission medications   Medication Sig Start Date End Date Taking? Authorizing Provider  apixaban (ELIQUIS) 2.5 MG TABS tablet Take 1 tablet (2.5 mg  total) by mouth 2 (two) times daily. 01/24/16   Bettey Costa, MD  atorvastatin (LIPITOR) 10 MG tablet Take 10 mg by mouth daily.    Historical Provider, MD  baclofen (LIORESAL) 10 MG tablet Take 10 mg by mouth 3 (three) times daily.    Historical Provider, MD  calcium gluconate 500 MG tablet  Take 1 tablet by mouth 3 (three) times daily.    Historical Provider, MD  cholecalciferol (VITAMIN D) 1000 UNITS tablet Take 5,000 Units by mouth daily.     Historical Provider, MD  docusate sodium (COLACE) 100 MG capsule Take 100 mg by mouth 2 (two) times daily.    Historical Provider, MD  ferrous sulfate (FER-IN-SOL) 75 (15 FE) MG/ML SOLN Take 65 mg by mouth.    Historical Provider, MD  magnesium oxide (MAG-OX) 400 MG tablet Take 400 mg by mouth daily.    Historical Provider, MD  nicotine (NICODERM CQ - DOSED IN MG/24 HOURS) 21 mg/24hr patch Place 1 patch (21 mg total) onto the skin daily. 01/24/16   Bettey Costa, MD  sotalol (BETAPACE) 80 MG tablet Take 0.5 tablets (40 mg total) by mouth 2 (two) times daily. 01/24/16   Bettey Costa, MD      PHYSICAL EXAMINATION:   VITAL SIGNS: Blood pressure 117/72, pulse 63, temperature 99.3 F (37.4 C), temperature source Oral, resp. rate 18, height 5\' 3"  (1.6 m), weight 54.4 kg (120 lb), SpO2 99 %.  GENERAL:  83 y.o.-year-old patient lying in the bed with no acute distress.  EYES: Pupils equal, round, reactive to light and accommodation. No scleral icterus. Extraocular muscles intact.  HEENT: Head atraumatic, normocephalic. Oropharynx dry and nasopharynx clear.  NECK:  Supple, no jugular venous distention. No thyroid enlargement, no tenderness.  LUNGS: Normal breath sounds bilaterally, no wheezing, rales,rhonchi or crepitation. No use of accessory muscles of respiration.  CARDIOVASCULAR: S1, S2 normal. No murmurs, rubs, or gallops.  ABDOMEN: Soft, tenderness noted around the umbilicus, nondistended. Bowel sounds present. No organomegaly or mass.  EXTREMITIES: No pedal edema, cyanosis, or clubbing.  NEUROLOGIC: Cranial nerves II through XII are intact. Muscle strength 5/5 in all extremities. Sensation intact. Gait not checked.  PSYCHIATRIC: The patient is alert and oriented x 3.  SKIN: No obvious rash, lesion, or ulcer.   LABORATORY PANEL:   CBC  Recent  Labs Lab 11/26/16 0134  WBC 10.1  HGB 14.1  HCT 40.7  PLT 211  MCV 98.1  MCH 33.9  MCHC 34.6  RDW 14.4  LYMPHSABS 0.5*  MONOABS 0.1*  EOSABS 0.0  BASOSABS 0.0   ------------------------------------------------------------------------------------------------------------------  Chemistries   Recent Labs Lab 11/26/16 0134  NA 135  K 3.6  CL 102  CO2 25  GLUCOSE 173*  BUN 17  CREATININE 0.98  CALCIUM 8.8*  AST 52*  ALT 33  ALKPHOS 48  BILITOT 0.7   ------------------------------------------------------------------------------------------------------------------ estimated creatinine clearance is 32.8 mL/min (by C-G formula based on SCr of 0.98 mg/dL). ------------------------------------------------------------------------------------------------------------------ No results for input(s): TSH, T4TOTAL, T3FREE, THYROIDAB in the last 72 hours.  Invalid input(s): FREET3   Coagulation profile No results for input(s): INR, PROTIME in the last 168 hours. ------------------------------------------------------------------------------------------------------------------- No results for input(s): DDIMER in the last 72 hours. -------------------------------------------------------------------------------------------------------------------  Cardiac Enzymes No results for input(s): CKMB, TROPONINI, MYOGLOBIN in the last 168 hours.  Invalid input(s): CK ------------------------------------------------------------------------------------------------------------------ Invalid input(s): POCBNP  ---------------------------------------------------------------------------------------------------------------  Urinalysis    Component Value Date/Time   COLORURINE YELLOW (A) 01/20/2016 1153   APPEARANCEUR CLOUDY (A) 01/20/2016 1153  APPEARANCEUR Hazy 01/16/2014 1550   LABSPEC 1.011 01/20/2016 1153   LABSPEC 1.020 01/16/2014 1550   PHURINE 7.0 01/20/2016 1153   GLUCOSEU  NEGATIVE 01/20/2016 1153   GLUCOSEU Negative 01/16/2014 1550   HGBUR NEGATIVE 01/20/2016 1153   BILIRUBINUR NEGATIVE 01/20/2016 1153   BILIRUBINUR Negative 01/16/2014 [place] 01/20/2016 1153   PROTEINUR NEGATIVE 01/20/2016 1153   NITRITE NEGATIVE 01/20/2016 1153   LEUKOCYTESUR 2+ (A) 01/20/2016 1153   LEUKOCYTESUR 3+ 01/16/2014 1550     RADIOLOGY: Dg Chest 2 View  Result Date: 11/26/2016 CLINICAL DATA:  Numerous episodes of nausea, vomiting, and diarrhea. Low midline abdominal pain. Cough for 1 day. Fever. EXAM: CHEST  2 VIEW COMPARISON:  01/23/2016 FINDINGS: Cardiac pacemaker. Normal heart size and pulmonary vascularity. Calcification in the mitral valve annulus. Emphysematous changes and interstitial fibrosis in the lungs. No focal consolidation or airspace disease. No blunting of costophrenic angles. No pneumothorax. Calcification of the aorta. Large esophageal hiatal hernia behind the heart. Degenerative changes in the spine. Anterior wedging of T12 is unchanged since previous two view chest from 01/16/2014. IMPRESSION: Emphysematous changes and fibrosis in the lungs. No focal consolidation. Esophageal hiatal hernia behind the heart. Electronically Signed   By: Lucienne Capers M.D.   On: 11/26/2016 03:03   Ct Abdomen Pelvis W Contrast  Result Date: 11/26/2016 CLINICAL DATA:  Nausea, vomiting, and diarrhea. Low midline abdominal pain. Acute left lower quadrant pain. Three others in the house with similar symptoms and diagnosed with influenza. EXAM: CT ABDOMEN AND PELVIS WITH CONTRAST TECHNIQUE: Multidetector CT imaging of the abdomen and pelvis was performed using the standard protocol following bolus administration of intravenous contrast. CONTRAST:  169mL ISOVUE-300 IOPAMIDOL (ISOVUE-300) INJECTION 61% COMPARISON:  None. FINDINGS: Lower chest: Emphysematous changes and fibrosis in the lung bases. Large esophageal hiatal hernia. Calcification in the mitral valve annulus.  Hepatobiliary: No focal liver abnormality is seen. No gallstones, gallbladder wall thickening, or biliary dilatation. Pancreas: Unremarkable. No pancreatic ductal dilatation or surrounding inflammatory changes. Spleen: Normal in size without focal abnormality. Adrenals/Urinary Tract: Adrenal glands are unremarkable. Kidneys are normal, without renal calculi, focal lesion, or hydronephrosis. Bladder is unremarkable. Stomach/Bowel: Gastric wall is not abnormally thickened and no evidence of gastric obstruction. Large duodenal diverticulum. Fluid-filled nondistended small bowel without significant wall thickening may indicate enteritis or ileus. Gas and stool throughout the colon. Mild colonic wall thickening involving the cecum is probably inflammatory. Consider colitis. Appendix is not definitively identified. Diverticulosis of the sigmoid colon. No evidence of diverticulitis. Vascular/Lymphatic: There is an infrarenal abdominal aortic aneurysm measuring 3.6 cm maximal AP diameter. Aorta is tortuous and calcified but remains patent. No dissection or mural thrombus. No significant retroperitoneal lymphadenopathy. Reproductive: Uterus and bilateral adnexa are unremarkable. Other: No free air or free fluid in the abdomen. Abdominal wall musculature appears intact. Musculoskeletal: Degenerative changes in the spine. Mild anterior compression of T12 is stable since previous chest radiographs. IMPRESSION: No evidence of bowel obstruction. Fluid-filled nondistended small bowel may be due to enteritis or ileus. Wall thickening in the cecum suggesting inflammatory process or colitis. Diverticulosis of sigmoid colon without evidence of diverticulitis. Large esophageal hiatal hernia. A large duodenal diverticulum. **An incidental finding of potential clinical significance has been found. Infrarenal abdominal aortic aneurysm measuring 3.6 cm diameter. Recommend followup by ultrasound in 2 years. This recommendation follows ACR  consensus guidelines: White Paper of the ACR Incidental Findings Committee II on Vascular Findings. J Am Coll Radiol 2013; 10:789-794.** Electronically Signed   By:  Lucienne Capers M.D.   On: 11/26/2016 03:11    EKG: Orders placed or performed during the hospital encounter of 11/26/16  . ED EKG  . ED EKG  . EKG 12-Lead  . EKG 12-Lead    IMPRESSION AND PLAN: 83 year old female patient with history of hypertension, hyperlipidemia, peripheral vascular disease, uterine cancer, carotid artery disease presented to the emergency room with abdominal pain, nausea and vomiting. Admitting diagnosis 1. Intractable nausea vomiting 2. Acute enteritis 3. Diarrhea 4. Infrarenal abdominal aneurysm 5. Abdominal pain 6. Hypertension 7. Peripheral vascular disease Treatment plan Admit patient to medical floor IV fluid hydration with potassium supplementation Control nausea vomiting with IV Zofran Pain management with IV morphine Clear liquid diet Continue oral eliquis for anticoagulation Check electrolytes Supportive care  All the records are reviewed and case discussed with ED provider. Management plans discussed with the patient, family and they are in agreement.  CODE STATUS:FULL CODE Surrogate Decision Maker : [place] daughter Code Status History    Date Active Date Inactive Code Status Order ID Comments User Context   01/20/2016  3:12 PM 01/24/2016  5:13 PM Full Code [id number]  Nicholes Mango, MD Inpatient   03/09/2015 12:11 PM 03/10/2015  4:18 PM Full Code [id number]  Algernon Huxley, MD Inpatient    Advance Directive Documentation     Most Recent Value  Type of Advance Directive  Healthcare Power of [place], Living will  Pre-existing out of facility DNR order (yellow form or pink MOST form)  -  "MOST" Form in Place?  -       TOTAL TIME TAKING CARE OF THIS PATIENT: 52 minutes.    Saundra Shelling M.D on 11/26/2016 at 4:10 AM  Between 7am to 6pm - Pager - [phone]  After 6pm go to  [email or web address] - password [email or web address] [facility]  Office  [phone]  CC: Primary care physician; Juluis Pitch, MD

## 2016-11-26 NOTE — ED Notes (Signed)
Pt transport to 135

## 2016-11-26 NOTE — ED Notes (Addendum)
Pt reports relief from nausea following Zofran administration at 0145.

## 2016-11-26 NOTE — ED Triage Notes (Signed)
Pt reports numerous episodes of N/V/D; c/o low midline abd pain; 3 others in the house with similar symptoms diagnosed with influenza; pt has had to stop triage already to use the restroom

## 2016-11-26 NOTE — ED Provider Notes (Signed)
[facility] Emergency Department Provider Note    First MD Initiated Contact with Patient 11/26/16 0134     (approximate)  I have reviewed the triage vital signs and the nursing notes.   HISTORY  Chief Complaint Emesis; Diarrhea; and Abdominal Pain    HPI Jamie Spence is a 83 y.o. female presents for 24 hours of nausea vomiting and diarrhea with med generalized abdominal pain. 3 other members of the household were diagnosed with influenza yesterday and are having similar symptoms. Patient is been unable to keep any medication down. Denies any cough or shortness of breath.   Past Medical History:  Diagnosis Date  . Cancer ([place])    uterine  . Carotid artery disease ([place])   . Hyperlipemia   . Hypertension   . Osteopenia   . Peripheral vascular disease ([place])    Family History  Problem Relation Age of Onset  . Diabetes Neg Hx    Past Surgical History:  Procedure Laterality Date  . PACEMAKER INSERTION Left 01/23/2016   Procedure: INSERTION PACEMAKER;  Surgeon: Isaias Cowman, MD;  Location: [facility] ORS;  Service: Cardiovascular;  Laterality: Left;  . PERIPHERAL VASCULAR CATHETERIZATION Left 03/09/2015   Procedure: Carotid PTA/Stent Intervention;  Surgeon: [name], MD;  Location: [facility];  Service: Cardiovascular;  Laterality: Left;  . PERIPHERAL VASCULAR CATHETERIZATION  03/09/2015   Procedure: Carotid Angiography;  Surgeon: [name], MD;  Location: [facility];  Service: Cardiovascular;;   Patient Active Problem List   Diagnosis Date Noted  . Enteritis 11/26/2016  . Acquired heart block 01/20/2016  . Carotid stenosis 03/09/2015      Prior to Admission medications   Medication Sig Start Date End Date Taking? Authorizing Provider  apixaban (ELIQUIS) 2.5 MG TABS tablet Take 1 tablet (2.5 mg total) by mouth 2 (two) times daily. 01/24/16  Yes Bettey Costa, MD  baclofen (LIORESAL) 10 MG tablet Take 10 mg  by mouth 3 (three) times daily.   Yes Historical Provider, MD  calcium gluconate 500 MG tablet Take 1 tablet by mouth 3 (three) times daily.   Yes Historical Provider, MD  cholecalciferol (VITAMIN D) 1000 UNITS tablet Take 5,000 Units by mouth daily.    Yes Historical Provider, MD  sotalol (BETAPACE) 80 MG tablet Take 0.5 tablets (40 mg total) by mouth 2 (two) times daily. 01/24/16  Yes Sital Mody, MD  atorvastatin (LIPITOR) 10 MG tablet Take 10 mg by mouth daily.    Historical Provider, MD  docusate sodium (COLACE) 100 MG capsule Take 100 mg by mouth 2 (two) times daily.    Historical Provider, MD  ferrous sulfate (FER-IN-SOL) 75 (15 FE) MG/ML SOLN Take 65 mg by mouth.    Historical Provider, MD  magnesium oxide (MAG-OX) 400 MG tablet Take 400 mg by mouth daily.    Historical Provider, MD  nicotine (NICODERM CQ - DOSED IN MG/24 HOURS) 21 mg/24hr patch Place 1 patch (21 mg total) onto the skin daily. Patient not taking: Reported on 11/26/2016 01/24/16   Bettey Costa, MD    Allergies Patient has no known allergies.    Social History Social History  Substance Use Topics  . Smoking status: Current Every Day Smoker    Packs/day: 1.00    Years: 60.00  . Smokeless tobacco: Never Used  . Alcohol use No    Review of Systems Patient denies headaches, rhinorrhea, blurry vision, numbness, shortness of breath, chest pain, edema, cough, abdominal pain, nausea,  vomiting, diarrhea, dysuria, fevers, rashes or hallucinations unless otherwise stated above in HPI. ____________________________________________   PHYSICAL EXAM:  VITAL SIGNS: Vitals:   11/26/16 0526 11/26/16 0620  BP: 117/60 105/66  Pulse: 60 62  Resp: 20 18  Temp:      Constitutional: Alert and oriented. ill appearing Eyes: Conjunctivae are normal. PERRL. EOMI. Head: Atraumatic. Nose: No congestion/rhinnorhea. Mouth/Throat: Mucous membranes are moist.  Oropharynx non-erythematous. Neck: No stridor. Painless ROM. No cervical  spine tenderness to palpation Hematological/Lymphatic/Immunilogical: No cervical lymphadenopathy. Cardiovascular: Normal rate, regular rhythm. Grossly normal heart sounds.  Good peripheral circulation. Respiratory: mildly tachypnic with coarse breathsounds throughtout Gastrointestinal: Soft and nontender. No distention. No abdominal bruits. No CVA tenderness. Genitourinary:  Musculoskeletal: No lower extremity tenderness nor edema.  No joint effusions. Neurologic:  Normal speech and language. No gross focal neurologic deficits are appreciated. No gait instability. Skin:  Skin is warm, dry and intact. No rash noted. Psychiatric: Mood and affect are normal. Speech and behavior are normal.  ____________________________________________   LABS (all labs ordered are listed, but only abnormal results are displayed)  Results for orders placed or performed during the hospital encounter of 11/26/16 (from the past 24 hour(s))  Lipase, blood     Status: None   Collection Time: 11/26/16  1:34 AM  Result Value Ref Range   Lipase 17 11 - 51 U/L  Comprehensive metabolic panel     Status: Abnormal   Collection Time: 11/26/16  1:34 AM  Result Value Ref Range   Sodium 135 135 - 145 mmol/L   Potassium 3.6 3.5 - 5.1 mmol/L   Chloride 102 101 - 111 mmol/L   CO2 25 22 - 32 mmol/L   Glucose, Bld 173 (H) 65 - 99 mg/dL   BUN 17 6 - 20 mg/dL   Creatinine, Ser 0.98 0.44 - 1.00 mg/dL   Calcium 8.8 (L) 8.9 - 10.3 mg/dL   Total Protein 7.1 6.5 - 8.1 g/dL   Albumin 3.8 3.5 - 5.0 g/dL   AST 52 (H) 15 - 41 U/L   ALT 33 14 - 54 U/L   Alkaline Phosphatase 48 38 - 126 U/L   Total Bilirubin 0.7 0.3 - 1.2 mg/dL   GFR calc non Af Amer 50 (L) >60 mL/min   GFR calc Af Amer 58 (L) >60 mL/min   Anion gap 8 5 - 15  CBC     Status: None   Collection Time: 11/26/16  1:34 AM  Result Value Ref Range   WBC 10.1 3.6 - 11.0 K/uL   RBC 4.15 3.80 - 5.20 MIL/uL   Hemoglobin 14.1 12.0 - 16.0 g/dL   HCT 40.7 35.0 - 47.0 %    MCV 98.1 80.0 - 100.0 fL   MCH 33.9 26.0 - 34.0 pg   MCHC 34.6 32.0 - 36.0 g/dL   RDW 14.4 11.5 - 14.5 %   Platelets 211 150 - 440 K/uL  Differential     Status: Abnormal   Collection Time: 11/26/16  1:34 AM  Result Value Ref Range   Neutrophils Relative % 93 %   Neutro Abs 9.4 (H) 1.4 - 6.5 K/uL   Lymphocytes Relative 5 %   Lymphs Abs 0.5 (L) 1.0 - 3.6 K/uL   Monocytes Relative 1 %   Monocytes Absolute 0.1 (L) 0.2 - 0.9 K/uL   Eosinophils Relative 0 %   Eosinophils Absolute 0.0 0 - 0.7 K/uL   Basophils Relative 1 %   Basophils Absolute 0.0 0 - 0.1  K/uL  Lactic acid, plasma     Status: None   Collection Time: 11/26/16  3:09 AM  Result Value Ref Range   Lactic Acid, Venous 1.7 0.5 - 1.9 mmol/L  Blood Culture (routine x 2)     Status: None (Preliminary result)   Collection Time: 11/26/16  3:09 AM  Result Value Ref Range   Specimen Description BLOOD LEFT ANTECUBITAL    Special Requests      BOTTLES DRAWN AEROBIC AND ANAEROBIC BCHV HIGH VOLUME   Culture NO GROWTH < 12 HOURS    Report Status PENDING   Blood Culture (routine x 2)     Status: None (Preliminary result)   Collection Time: 11/26/16  3:09 AM  Result Value Ref Range   Specimen Description BLOOD RIGHT WRIST    Special Requests      BOTTLES DRAWN AEROBIC AND ANAEROBIC BCAV ADEQUATE VOLUME   Culture NO GROWTH < 12 HOURS    Report Status PENDING    ____________________________________________  EKG My review and personal interpretation at Time: 4:04   Indication: nausea  Rate: 60  Rhythm: a-v pac3ed Axis: left Other: no sgarbossa criteria, normal intervals ____________________________________________  RADIOLOGY  I personally reviewed all radiographic images ordered to evaluate for the above acute complaints and reviewed radiology reports and findings.  These findings were personally discussed with the patient.  Please see medical record for radiology  report.  ____________________________________________   PROCEDURES  Procedure(s) performed:  Procedures    Critical Care performed: no ____________________________________________   INITIAL IMPRESSION / ASSESSMENT AND PLAN / ED COURSE  Pertinent labs & imaging results that were available during my care of the patient were reviewed by me and considered in my medical decision making (see chart for details).  DDX: flu, enteritis, sbo, colitis, uti, pna,  LARONICA BHAGAT is a 83 y.o. who presents to the ED with evidence of nausea vomiting diarrhea and low-grade fever. Patient afebrile and hemodynamically stable in the ER. Does appear significantly dehydrated. Chest x-ray without evidence of consolidation. CT imaging ordered to evaluate for acute intra-abdominal process shows no evidence of acute surgical process. Based on symptoms and recent sick contacts with diagnosis flow will start patient with Tamiflu. Patient will require admission for additional IV fluid resuscitation as she is unable to take any medications by mouth and does appear very frail.      ____________________________________________   FINAL CLINICAL IMPRESSION(S) / ED DIAGNOSES  Final diagnoses:  Nausea vomiting and diarrhea  Flu-like symptoms  Dehydration      NEW MEDICATIONS STARTED DURING THIS VISIT:  Current Discharge Medication List       Note:  This document was prepared using Dragon voice recognition software and may include unintentional dictation errors.    Merlyn Lot, MD 11/26/16 [phone]

## 2016-11-26 NOTE — Progress Notes (Signed)
Patient ID: Jamie Spence, female   DOB: 08-17-1926, 83 y.o.   MRN: [id number]  [facility] PROGRESS NOTE  VAUNDA GUTTERMAN JOA:[id number] DOB: [birth year] DOA: 11/26/2016 PCP: Juluis Pitch, MD  HPI/Subjective: Patient with nausea and dry heaves. No further vomiting since last night. Also having diarrhea. She states that people at home have the flu and also had a stomach virus.  Objective: Vitals:   11/26/16 1219 11/26/16 1450  BP: (!) 101/45 (!) 115/40  Pulse: 62 61  Resp: 16 18  Temp: 100 F (37.8 C) 99.2 F (37.3 C)    Filed Weights   11/26/16 0107 11/26/16 0526  Weight: 54.4 kg (120 lb) 55.9 kg (123 lb 4.8 oz)    ROS: Review of Systems  Constitutional: Negative for chills and fever.  Eyes: Negative for blurred vision.  Respiratory: Positive for shortness of breath. Negative for cough.   Cardiovascular: Negative for chest pain.  Gastrointestinal: Positive for abdominal pain, diarrhea, nausea and vomiting. Negative for constipation.  Genitourinary: Negative for dysuria.  Musculoskeletal: Negative for joint pain.  Neurological: Negative for dizziness and headaches.   Exam: Physical Exam  Constitutional: She is oriented to person, place, and time.  HENT:  Nose: No mucosal edema.  Mouth/Throat: No oropharyngeal exudate or posterior oropharyngeal edema.  Eyes: Conjunctivae, EOM and lids are normal. Pupils are equal, round, and reactive to light.  Neck: No JVD present. Carotid bruit is not present. No edema present. No thyroid mass and no thyromegaly present.  Cardiovascular: S1 normal and S2 normal.  Exam reveals no gallop.   No murmur heard. Pulses:      Dorsalis pedis pulses are 2+ on the right side, and 2+ on the left side.  Respiratory: No respiratory distress. She has no wheezes. She has no rhonchi. She has no rales.  GI: Soft. Bowel sounds are normal. There is generalized tenderness.  Musculoskeletal:       Right ankle: She exhibits no swelling.   Lymphadenopathy:    She has no cervical adenopathy.  Neurological: She is alert and oriented to person, place, and time. No cranial nerve deficit.  Skin: Skin is warm. No rash noted. Nails show no clubbing.  Psychiatric: She has a normal mood and affect.      Data Reviewed: Basic Metabolic Panel:  Recent Labs Lab 11/26/16 0134  NA 135  K 3.6  CL 102  CO2 25  GLUCOSE 173*  BUN 17  CREATININE 0.98  CALCIUM 8.8*   Liver Function Tests:  Recent Labs Lab 11/26/16 0134  AST 52*  ALT 33  ALKPHOS 48  BILITOT 0.7  PROT 7.1  ALBUMIN 3.8    Recent Labs Lab 11/26/16 0134  LIPASE 17   CBC:  Recent Labs Lab 11/26/16 0134  WBC 10.1  NEUTROABS 9.4*  HGB 14.1  HCT 40.7  MCV 98.1  PLT 211     Recent Results (from the past 240 hour(s))  Blood Culture (routine x 2)     Status: None (Preliminary result)   Collection Time: 11/26/16  3:09 AM  Result Value Ref Range Status   Specimen Description BLOOD LEFT ANTECUBITAL  Final   Special Requests   Final    BOTTLES DRAWN AEROBIC AND ANAEROBIC BCHV HIGH VOLUME   Culture NO GROWTH < 12 HOURS  Final   Report Status PENDING  Incomplete  Blood Culture (routine x 2)     Status: None (Preliminary result)   Collection Time: 11/26/16  3:09 AM  Result Value  Ref Range Status   Specimen Description BLOOD RIGHT WRIST  Final   Special Requests   Final    BOTTLES DRAWN AEROBIC AND ANAEROBIC BCAV ADEQUATE VOLUME   Culture NO GROWTH < 12 HOURS  Final   Report Status PENDING  Incomplete     Studies: Dg Chest 2 View  Result Date: 11/26/2016 CLINICAL DATA:  Numerous episodes of nausea, vomiting, and diarrhea. Low midline abdominal pain. Cough for 1 day. Fever. EXAM: CHEST  2 VIEW COMPARISON:  01/23/2016 FINDINGS: Cardiac pacemaker. Normal heart size and pulmonary vascularity. Calcification in the mitral valve annulus. Emphysematous changes and interstitial fibrosis in the lungs. No focal consolidation or airspace disease. No blunting  of costophrenic angles. No pneumothorax. Calcification of the aorta. Large esophageal hiatal hernia behind the heart. Degenerative changes in the spine. Anterior wedging of T12 is unchanged since previous two view chest from 01/16/2014. IMPRESSION: Emphysematous changes and fibrosis in the lungs. No focal consolidation. Esophageal hiatal hernia behind the heart. Electronically Signed   By: Lucienne Capers M.D.   On: 11/26/2016 03:03   Ct Abdomen Pelvis W Contrast  Result Date: 11/26/2016 CLINICAL DATA:  Nausea, vomiting, and diarrhea. Low midline abdominal pain. Acute left lower quadrant pain. Three others in the house with similar symptoms and diagnosed with influenza. EXAM: CT ABDOMEN AND PELVIS WITH CONTRAST TECHNIQUE: Multidetector CT imaging of the abdomen and pelvis was performed using the standard protocol following bolus administration of intravenous contrast. CONTRAST:  145mL ISOVUE-300 IOPAMIDOL (ISOVUE-300) INJECTION 61% COMPARISON:  None. FINDINGS: Lower chest: Emphysematous changes and fibrosis in the lung bases. Large esophageal hiatal hernia. Calcification in the mitral valve annulus. Hepatobiliary: No focal liver abnormality is seen. No gallstones, gallbladder wall thickening, or biliary dilatation. Pancreas: Unremarkable. No pancreatic ductal dilatation or surrounding inflammatory changes. Spleen: Normal in size without focal abnormality. Adrenals/Urinary Tract: Adrenal glands are unremarkable. Kidneys are normal, without renal calculi, focal lesion, or hydronephrosis. Bladder is unremarkable. Stomach/Bowel: Gastric wall is not abnormally thickened and no evidence of gastric obstruction. Large duodenal diverticulum. Fluid-filled nondistended small bowel without significant wall thickening may indicate enteritis or ileus. Gas and stool throughout the colon. Mild colonic wall thickening involving the cecum is probably inflammatory. Consider colitis. Appendix is not definitively identified.  Diverticulosis of the sigmoid colon. No evidence of diverticulitis. Vascular/Lymphatic: There is an infrarenal abdominal aortic aneurysm measuring 3.6 cm maximal AP diameter. Aorta is tortuous and calcified but remains patent. No dissection or mural thrombus. No significant retroperitoneal lymphadenopathy. Reproductive: Uterus and bilateral adnexa are unremarkable. Other: No free air or free fluid in the abdomen. Abdominal wall musculature appears intact. Musculoskeletal: Degenerative changes in the spine. Mild anterior compression of T12 is stable since previous chest radiographs. IMPRESSION: No evidence of bowel obstruction. Fluid-filled nondistended small bowel may be due to enteritis or ileus. Wall thickening in the cecum suggesting inflammatory process or colitis. Diverticulosis of sigmoid colon without evidence of diverticulitis. Large esophageal hiatal hernia. A large duodenal diverticulum. **An incidental finding of potential clinical significance has been found. Infrarenal abdominal aortic aneurysm measuring 3.6 cm diameter. Recommend followup by ultrasound in 2 years. This recommendation follows ACR consensus guidelines: White Paper of the ACR Incidental Findings Committee II on Vascular Findings. J Am Coll Radiol 2013; 10:789-794.** Electronically Signed   By: Lucienne Capers M.D.   On: 11/26/2016 03:11    Scheduled Meds: . apixaban  2.5 mg Oral BID  . atorvastatin  10 mg Oral Daily  . sotalol  40 mg Oral BID  Continuous Infusions: . 0.9 % NaCl with KCl 20 mEq / L 50 mL/hr at 11/26/16 1118    Assessment/Plan:  1. Gastroenteritis with enteritis seen on CT scan. Send off stool studies. Could be viral in nature since family member also had the same thing. When necessary nausea medications. When necessary pain medications. Advance diet to full liquid diet. 2. Hypokalemia replace potassium in IV fluids 3. Peripheral vascular disease and arrhythmia on sotalol and eliquis. 4. Hyperlipidemia  unspecified on atorvastatin 5. Abdominal aortic aneurysm. Needs follow-up as outpatient 6. History of uterine cancer  Code Status:     Code Status Orders        Start     Ordered   11/26/16 0602  Full code  Continuous     11/26/16 0601    Code Status History    Date Active Date Inactive Code Status Order ID Comments User Context   01/20/2016  3:12 PM 01/24/2016  5:13 PM Full Code [id number]  Nicholes Mango, MD Inpatient   03/09/2015 12:11 PM 03/10/2015  4:18 PM Full Code [id number]  Algernon Huxley, MD Inpatient    Advance Directive Documentation     Most Recent Value  Type of Advance Directive  Healthcare Power of Attorney, Living will  Pre-existing out of facility DNR order (yellow form or pink MOST form)  -  "MOST" Form in Place?  -     Disposition Plan: To be determined  Time spent: 35 minutes  Loletha Grayer  [employer]

## 2016-11-26 NOTE — ED Notes (Signed)
Pt's O2 saturation dropped to 84% pt placed on 4L nasal canula.

## 2016-11-26 NOTE — Progress Notes (Signed)
Patient with several complaints of nausea. zofran given. O2 reduced 2L

## 2016-11-27 LAB — URINALYSIS, COMPLETE (UACMP) WITH MICROSCOPIC
BACTERIA UA: NONE SEEN
Bilirubin Urine: NEGATIVE
Glucose, UA: NEGATIVE mg/dL
Hgb urine dipstick: NEGATIVE
Ketones, ur: NEGATIVE mg/dL
Nitrite: NEGATIVE
Protein, ur: NEGATIVE mg/dL
SPECIFIC GRAVITY, URINE: 1.013 (ref 1.005–1.030)
pH: 6 (ref 5.0–8.0)

## 2016-11-27 LAB — CBC WITH DIFFERENTIAL/PLATELET
Basophils Absolute: 0 10*3/uL (ref 0–0.1)
Basophils Relative: 0 %
EOS ABS: 0 10*3/uL (ref 0–0.7)
Eosinophils Relative: 0 %
HCT: 34 % — ABNORMAL LOW (ref 35.0–47.0)
Hemoglobin: 11.5 g/dL — ABNORMAL LOW (ref 12.0–16.0)
Lymphocytes Relative: 19 %
Lymphs Abs: 2.3 10*3/uL (ref 1.0–3.6)
MCH: 34.1 pg — ABNORMAL HIGH (ref 26.0–34.0)
MCHC: 34 g/dL (ref 32.0–36.0)
MCV: 100.3 fL — ABNORMAL HIGH (ref 80.0–100.0)
MONO ABS: 0.7 10*3/uL (ref 0.2–0.9)
MONOS PCT: 6 %
Neutro Abs: 9.2 10*3/uL — ABNORMAL HIGH (ref 1.4–6.5)
Neutrophils Relative %: 75 %
Platelets: 144 10*3/uL — ABNORMAL LOW (ref 150–440)
RBC: 3.39 MIL/uL — ABNORMAL LOW (ref 3.80–5.20)
RDW: 14.5 % (ref 11.5–14.5)
WBC: 12.2 10*3/uL — ABNORMAL HIGH (ref 3.6–11.0)

## 2016-11-27 LAB — BASIC METABOLIC PANEL
Anion gap: 5 (ref 5–15)
BUN: 13 mg/dL (ref 6–20)
CALCIUM: 7.3 mg/dL — AB (ref 8.9–10.3)
CO2: 23 mmol/L (ref 22–32)
CREATININE: 0.81 mg/dL (ref 0.44–1.00)
Chloride: 103 mmol/L (ref 101–111)
GLUCOSE: 100 mg/dL — AB (ref 65–99)
Potassium: 4.7 mmol/L (ref 3.5–5.1)
Sodium: 131 mmol/L — ABNORMAL LOW (ref 135–145)

## 2016-11-27 MED ORDER — OXYCODONE HCL 5 MG PO TABS: 5.0000 mg | ORAL_TABLET | ORAL | Status: DC | PRN

## 2016-11-27 MED ORDER — SODIUM CHLORIDE 0.9 % IV SOLN
INTRAVENOUS | Status: DC
  Administered 2016-11-27: 11:00:00 via INTRAVENOUS

## 2016-11-27 NOTE — Clinical Social Work Note (Signed)
Clinical Social Work Assessment  Patient Details  Name: Jamie Spence MRN: [id number] Date of Birth: [birth year]  Date of referral:  11/27/16               Reason for consult:  Facility Placement, Discharge Planning                Permission sought to share information with:  Chartered certified accountant granted to share information::  Yes, Verbal Permission Granted  Name::      [place]::   [place]   Relationship::     Contact Information:     Housing/Transportation Living arrangements for the past 2 months:  [place] of Information:  Patient, Other (Comment Required) (Patients granddaughter Social worker) Patient Interpreter Needed:  None Criminal Activity/Legal Involvement Pertinent to Current Situation/Hospitalization:  No - Comment as needed Significant Relationships:  Other Family Members Lives with:  Relatives Do you feel safe going back to the place where you live?  Yes Need for family participation in patient care:  Yes (Comment)  Care giving concerns:  Patient lives in a basement apartment at her granddaughter [place] in [place].    Social Worker assessment / plan:  Social work Systems developer consult from PT. PT is recommending SNF. Social work Theatre manager met with patient alone at bedside. Patient was alert and oriented x4 and was laying in bed watching TV. Per patient, she lives at home in a basement apartment at her granddaughter [place] Spence in [place]. Granddaughter Raquel Sarna is patient's HPOA. Social work Theatre manager explained that PT is recommending SNF. Patient is refusing SNF and wants to go home and receive home health. Patient wanted me to call her granddaughter. Per Raquel Sarna, patient can come home and receive home health if needed. RN case manager is aware of above. Social work Theatre manager will continue to assist and follow as needed.   FL2 completed and faxed out.   Employment status:  Unemployed Radiation protection practitioner:  Medicare PT Recommendations:  [place] / Referral to community resources:  [place]  Patient/Family's Response to care:  Patient refused SNF and wants to go home and receive home health. RN case manager is aware of above.   Patient/Family's Understanding of and Emotional Response to Diagnosis, Current Treatment, and Prognosis:  Patient was pleasant and thanked social work Theatre manager for calling.   Emotional Assessment Appearance:  Appears stated age Attitude/Demeanor/Rapport:    Affect (typically observed):  Accepting, Adaptable, Appropriate Orientation:  Oriented to Self, Oriented to Place, Oriented to  Time, Oriented to Situation Alcohol / Substance use:  Not Applicable Psych involvement (Current and /or in the community):  No (Comment)  Discharge Needs  Concerns to be addressed:  Basic Needs Readmission within the last 30 days:  No Current discharge risk:  Chronically ill Barriers to Discharge:  Continued Medical Work up   [employer], [place] Work 11/27/2016, 2:51 PM

## 2016-11-27 NOTE — Progress Notes (Signed)
Patient ID: Jamie Spence, female   DOB: February 09, 1926, 83 y.o.   MRN: [id number]   [facility] PROGRESS NOTE  Jamie Spence MPN:[id number] DOB: [birth year] DOA: 11/26/2016 PCP: Jamie Pitch, MD  HPI/Subjective: Patient with nausea but no further vomiting. Patient still with abdominal pain and soreness. Has not had a bowel movement since coming in. Breathing fine.  Objective: Vitals:   11/26/16 2325 11/27/16 0806  BP: (!) 109/51 136/63  Pulse: 62 74  Resp: 20 16  Temp: 99.9 F (37.7 C) 98.4 F (36.9 C)    Filed Weights   11/26/16 0107 11/26/16 0526  Weight: 54.4 kg (120 lb) 55.9 kg (123 lb 4.8 oz)    ROS: Review of Systems  Constitutional: Negative for chills and fever.  Eyes: Negative for blurred vision.  Respiratory: Negative for cough and shortness of breath.   Cardiovascular: Negative for chest pain.  Gastrointestinal: Positive for abdominal pain and nausea. Negative for constipation, diarrhea and vomiting.  Genitourinary: Negative for dysuria.  Musculoskeletal: Negative for joint pain.  Neurological: Negative for dizziness and headaches.   Exam: Physical Exam  Constitutional: She is oriented to person, place, and time.  HENT:  Nose: No mucosal edema.  Mouth/Throat: No oropharyngeal exudate or posterior oropharyngeal edema.  Eyes: Conjunctivae, EOM and lids are normal. Pupils are equal, round, and reactive to light.  Neck: No JVD present. Carotid bruit is not present. No edema present. No thyroid mass and no thyromegaly present.  Cardiovascular: S1 normal and S2 normal.  Exam reveals no gallop.   No murmur heard. Pulses:      Dorsalis pedis pulses are 2+ on the right side, and 2+ on the left side.  Respiratory: No respiratory distress. She has no wheezes. She has no rhonchi. She has no rales.  GI: Soft. Bowel sounds are normal. There is generalized tenderness.  Musculoskeletal:       Right ankle: She exhibits no swelling.  Lymphadenopathy:    She has  no cervical adenopathy.  Neurological: She is alert and oriented to person, place, and time. No cranial nerve deficit.  Skin: Skin is warm. No rash noted. Nails show no clubbing.  Psychiatric: She has a normal mood and affect.      Data Reviewed: Basic Metabolic Panel:  Recent Labs Lab 11/26/16 0134 11/27/16 0311  NA 135 131*  K 3.6 4.7  CL 102 103  CO2 25 23  GLUCOSE 173* 100*  BUN 17 13  CREATININE 0.98 0.81  CALCIUM 8.8* 7.3*   Liver Function Tests:  Recent Labs Lab 11/26/16 0134  AST 52*  ALT 33  ALKPHOS 48  BILITOT 0.7  PROT 7.1  ALBUMIN 3.8    Recent Labs Lab 11/26/16 0134  LIPASE 17   CBC:  Recent Labs Lab 11/26/16 0134 11/27/16 0311  WBC 10.1 12.2*  NEUTROABS 9.4* 9.2*  HGB 14.1 11.5*  HCT 40.7 34.0*  MCV 98.1 100.3*  PLT 211 144*     Recent Results (from the past 240 hour(s))  Blood Culture (routine x 2)     Status: None (Preliminary result)   Collection Time: 11/26/16  3:09 AM  Result Value Ref Range Status   Specimen Description BLOOD LEFT ANTECUBITAL  Final   Special Requests   Final    BOTTLES DRAWN AEROBIC AND ANAEROBIC BCHV HIGH VOLUME   Culture NO GROWTH 1 DAY  Final   Report Status PENDING  Incomplete  Blood Culture (routine x 2)     Status: None (Preliminary  result)   Collection Time: 11/26/16  3:09 AM  Result Value Ref Range Status   Specimen Description BLOOD RIGHT WRIST  Final   Special Requests   Final    BOTTLES DRAWN AEROBIC AND ANAEROBIC BCAV ADEQUATE VOLUME   Culture NO GROWTH 1 DAY  Final   Report Status PENDING  Incomplete     Studies: Dg Chest 2 View  Result Date: 11/26/2016 CLINICAL DATA:  Numerous episodes of nausea, vomiting, and diarrhea. Low midline abdominal pain. Cough for 1 day. Fever. EXAM: CHEST  2 VIEW COMPARISON:  01/23/2016 FINDINGS: Cardiac pacemaker. Normal heart size and pulmonary vascularity. Calcification in the mitral valve annulus. Emphysematous changes and interstitial fibrosis in the  lungs. No focal consolidation or airspace disease. No blunting of costophrenic angles. No pneumothorax. Calcification of the aorta. Large esophageal hiatal hernia behind the heart. Degenerative changes in the spine. Anterior wedging of T12 is unchanged since previous two view chest from 01/16/2014. IMPRESSION: Emphysematous changes and fibrosis in the lungs. No focal consolidation. Esophageal hiatal hernia behind the heart. Electronically Signed   By: Lucienne Capers M.D.   On: 11/26/2016 03:03   Ct Abdomen Pelvis W Contrast  Result Date: 11/26/2016 CLINICAL DATA:  Nausea, vomiting, and diarrhea. Low midline abdominal pain. Acute left lower quadrant pain. Three others in the house with similar symptoms and diagnosed with influenza. EXAM: CT ABDOMEN AND PELVIS WITH CONTRAST TECHNIQUE: Multidetector CT imaging of the abdomen and pelvis was performed using the standard protocol following bolus administration of intravenous contrast. CONTRAST:  196mL ISOVUE-300 IOPAMIDOL (ISOVUE-300) INJECTION 61% COMPARISON:  None. FINDINGS: Lower chest: Emphysematous changes and fibrosis in the lung bases. Large esophageal hiatal hernia. Calcification in the mitral valve annulus. Hepatobiliary: No focal liver abnormality is seen. No gallstones, gallbladder wall thickening, or biliary dilatation. Pancreas: Unremarkable. No pancreatic ductal dilatation or surrounding inflammatory changes. Spleen: Normal in size without focal abnormality. Adrenals/Urinary Tract: Adrenal glands are unremarkable. Kidneys are normal, without renal calculi, focal lesion, or hydronephrosis. Bladder is unremarkable. Stomach/Bowel: Gastric wall is not abnormally thickened and no evidence of gastric obstruction. Large duodenal diverticulum. Fluid-filled nondistended small bowel without significant wall thickening may indicate enteritis or ileus. Gas and stool throughout the colon. Mild colonic wall thickening involving the cecum is probably inflammatory.  Consider colitis. Appendix is not definitively identified. Diverticulosis of the sigmoid colon. No evidence of diverticulitis. Vascular/Lymphatic: There is an infrarenal abdominal aortic aneurysm measuring 3.6 cm maximal AP diameter. Aorta is tortuous and calcified but remains patent. No dissection or mural thrombus. No significant retroperitoneal lymphadenopathy. Reproductive: Uterus and bilateral adnexa are unremarkable. Other: No free air or free fluid in the abdomen. Abdominal wall musculature appears intact. Musculoskeletal: Degenerative changes in the spine. Mild anterior compression of T12 is stable since previous chest radiographs. IMPRESSION: No evidence of bowel obstruction. Fluid-filled nondistended small bowel may be due to enteritis or ileus. Wall thickening in the cecum suggesting inflammatory process or colitis. Diverticulosis of sigmoid colon without evidence of diverticulitis. Large esophageal hiatal hernia. A large duodenal diverticulum. **An incidental finding of potential clinical significance has been found. Infrarenal abdominal aortic aneurysm measuring 3.6 cm diameter. Recommend followup by ultrasound in 2 years. This recommendation follows ACR consensus guidelines: White Paper of the ACR Incidental Findings Committee II on Vascular Findings. J Am Coll Radiol 2013; 10:789-794.** Electronically Signed   By: Lucienne Capers M.D.   On: 11/26/2016 03:11    Scheduled Meds: . apixaban  2.5 mg Oral BID  . atorvastatin  10  mg Oral Daily  . sotalol  40 mg Oral BID   Continuous Infusions: . sodium chloride      Assessment/Plan:  1. Gastroenteritis with enteritis seen on CT scan. Send off stool studies, If the patient has a bowel movement. Could be viral in nature since family member also had the same thing. When necessary nausea medications. When necessary pain medications. Patient would like to advance diet to solid food today. I need to see the patient eat and tolerate a diet prior to  disposition. 2. Hypokalemia replaced 3. Peripheral vascular disease and atrial fibrillation on sotalol and eliquis. 4. Hyperlipidemia unspecified on atorvastatin 5. Abdominal aortic aneurysm. Needs follow-up as outpatient 6. History of uterine cancer  Code Status:     Code Status Orders        Start     Ordered   11/26/16 0602  Full code  Continuous     11/26/16 0601    Code Status History    Date Active Date Inactive Code Status Order ID Comments User Context   01/20/2016  3:12 PM 01/24/2016  5:13 PM Full Code [id number]  Nicholes Mango, MD Inpatient   03/09/2015 12:11 PM 03/10/2015  4:18 PM Full Code [id number]  Algernon Huxley, MD Inpatient    Advance Directive Documentation     Most Recent Value  Type of Advance Directive  Healthcare Power of [place], Living will  Pre-existing out of facility DNR order (yellow form or pink MOST form)  -  "MOST" Form in Place?  -     Disposition Plan: Evaluate daily for potential disposition  Time spent: 24 minutes  [place], [place]

## 2016-11-27 NOTE — Evaluation (Signed)
Physical Therapy Evaluation Patient Details Name: Jamie Spence MRN: [id number] DOB: 01-15-1926 Today's Date: 11/27/2016   History of Present Illness  83 y.o. female with a known history of Uterine cancer, carotid artery disease, hypertension, hyperlipidemia, peripheral vascular disease on oral liquids for antibiotic coagulation patient presented to the emergency room with abdominal pain, nausea vomiting and diarrhea.   Clinical Impression  Pt showed good effort t/o PT session but was more confident with her walking/balance than her performance indicated.  She hopes to be feeling better and be able to go home (where she could have essentially 24/7 assist if needed) but does acknowledge that she is not at her baseline and had some safety issues with walking with and w/o AD.  At this time pt needs STR but hopes to be able to go home with home health.    Follow Up Recommendations SNF    Equipment Recommendations       Recommendations for Other Services       Precautions / Restrictions Precautions Precautions: Fall (enteric isolation) Restrictions Weight Bearing Restrictions: No      Mobility  Bed Mobility Overal bed mobility: Modified Independent             General bed mobility comments: Pt able to get up to EOB w/o assist  Transfers Overall transfer level: Modified independent Equipment used: Rolling walker (2 wheeled)                Ambulation/Gait Ambulation/Gait assistance: Min assist Ambulation Distance (Feet): 55 Feet Assistive device: Rolling walker (2 wheeled);1 person hand held assist       General Gait Details: Pt was unsafe with walker with multiple episodes of veering and at least 2 small stagger steps.  She was able to do some walking with HHA but was heavily reliant on PT for balance and safety.  Stairs            Wheelchair Mobility    Modified Rankin (Stroke Patients Only)       Balance Overall balance assessment: Needs  assistance   Sitting balance-Leahy Scale: Good       Standing balance-Leahy Scale: Fair Standing balance comment: Pt with veering and unsteadiness during ambulation (w/ and w/o AD).                              Pertinent Vitals/Pain Pain Assessment: No/denies pain (just very nauseated)    Home Living Family/patient expects to be discharged to:: Private residence Living Arrangements: Other relatives Available Help at Discharge: Family Type of Home: Apartment (suite below granddaughters home) Home Access:  (1/2 step to stoop)     Home Layout: Able to live on main level with bedroom/bathroom Home Equipment: Walker - 2 wheels;Cane - quad      Prior Function Level of Independence: Independent         Comments: Pt drives, participates in Silver [employer]neakers 2x/week and generally is able to be active     Hand Dominance        Extremity/Trunk Assessment   Upper Extremity Assessment Upper Extremity Assessment: Generalized weakness (age appropriate limitations)    Lower Extremity Assessment Lower Extremity Assessment: Generalized weakness (age appropriate limitations)       Communication   Communication: No difficulties  Cognition Arousal/Alertness: Awake/alert Behavior During Therapy: WFL for tasks assessed/performed Overall Cognitive Status: Within Functional Limits for tasks assessed  General Comments      Exercises     Assessment/Plan    PT Assessment Patient needs continued PT services  PT Problem List Decreased strength;Decreased range of motion;Decreased activity tolerance;Decreased balance;Decreased mobility;Decreased coordination;Decreased knowledge of use of DME;Decreased safety awareness       PT Treatment Interventions Gait training;DME instruction;Functional mobility training;Therapeutic activities;Therapeutic exercise;Balance training;Patient/family education    PT Goals (Current goals can be found in the  Care Plan section)  Acute Rehab PT Goals Patient Stated Goal: "I really would rather go home if I can." PT Goal Formulation: With patient Time For Goal Achievement: 12/11/16 Potential to Achieve Goals: Good    Frequency Min 2X/week   Barriers to discharge        Co-evaluation               End of Session Equipment Utilized During Treatment: Gait belt Activity Tolerance: Patient limited by fatigue Patient left: with bed alarm set;with call bell/phone within reach   PT Visit Diagnosis: Muscle weakness (generalized) (M62.81);Difficulty in walking, not elsewhere classified (R26.2)         Time: 3007-6226 PT Time Calculation (min) (ACUTE ONLY): 22 min   Charges:   PT Evaluation $PT Eval Low Complexity: 1 Procedure     PT G Codes:         Kreg Shropshire, DPT 11/27/2016, 4:05 PM

## 2016-11-27 NOTE — NC FL2 (Signed)
[place] LEVEL OF CARE SCREENING TOOL     IDENTIFICATION  Patient Name: Jamie Spence Birthdate: 08/15/1926 Sex: female Admission Date (Current Location): 11/26/2016  [place] and [place] Number:  Engineering geologist and Address:  [facility], [address] [place], [place] [address]      Provider Number: [id number]  Attending Physician Name and Address:  Loletha Grayer, MD  Relative Name and Phone Number:       Current Level of Care: SNF Recommended Level of Care: [place] Prior Approval Number:    Date Approved/Denied:   PASRR Number:  [id number])  Discharge Plan: SNF    Current Diagnoses: Patient Active Problem List   Diagnosis Date Noted  . Enteritis 11/26/2016  . Acquired heart block 01/20/2016  . Carotid stenosis 03/09/2015    Orientation RESPIRATION BLADDER Height & Weight     Self, Situation, Place, Time  O2 (Nasal Cannula 4L/min) Continent Weight: 123 lb 4.8 oz (55.9 kg) Height:  5\' 3"  (160 cm)  BEHAVIORAL SYMPTOMS/MOOD NEUROLOGICAL BOWEL NUTRITION STATUS   (None. )  (None. ) Continent Diet (Diet: Soft)  AMBULATORY STATUS COMMUNICATION OF NEEDS Skin   Extensive Assist Verbally Normal                       Personal Care Assistance Level of Assistance  Bathing, Feeding, Dressing Bathing Assistance: Limited assistance Feeding assistance: Independent Dressing Assistance: Limited assistance     Functional Limitations Info  Sight, Hearing, Speech Sight Info: Adequate Hearing Info: Adequate Speech Info: Adequate    SPECIAL CARE FACTORS FREQUENCY  PT (By licensed PT), OT (By licensed OT)     PT Frequency:  (5) OT Frequency:  (5)            Contractures      Additional Factors Info  Code Status, Allergies Code Status Info:  (Full Code) Allergies Info:  (No Known Allergies)           Current Medications (11/27/2016):  This is the current hospital active medication  list Current Facility-Administered Medications  Medication Dose Route Frequency Provider Last Rate Last Dose  . 0.9 %  sodium chloride infusion   Intravenous Continuous Loletha Grayer, MD      . acetaminophen (TYLENOL) tablet 650 mg  650 mg Oral Q6H PRN Saundra Shelling, MD       Or  . acetaminophen (TYLENOL) suppository 650 mg  650 mg Rectal Q6H PRN Saundra Shelling, MD      . apixaban (ELIQUIS) tablet 2.5 mg  2.5 mg Oral BID Saundra Shelling, MD   2.5 mg at 11/27/16 0915  . atorvastatin (LIPITOR) tablet 10 mg  10 mg Oral Daily Saundra Shelling, MD   10 mg at 11/27/16 0914  . morphine 2 MG/ML injection 2 mg  2 mg Intravenous Q3H PRN Merlyn Lot, MD      . morphine 2 MG/ML injection 2 mg  2 mg Intravenous Q4H PRN Pavan Pyreddy, MD      . ondansetron (ZOFRAN) tablet 4 mg  4 mg Oral Q6H PRN Saundra Shelling, MD       Or  . ondansetron (ZOFRAN) injection 4 mg  4 mg Intravenous Q6H PRN Saundra Shelling, MD   4 mg at 11/26/16 1525  . oxyCODONE (Oxy IR/ROXICODONE) immediate release tablet 5 mg  5 mg Oral Q4H PRN Loletha Grayer, MD      . sotalol (BETAPACE) tablet 40 mg  40 mg Oral  BID Saundra Shelling, MD   40 mg at 11/27/16 1834     Discharge Medications: Please see discharge summary for a list of discharge medications.  Relevant Imaging Results:  Relevant Lab Results:   Additional Information  (SSN: [id number])  Danie Chandler, Student-Social Work

## 2016-11-27 NOTE — Clinical Social Work Placement (Signed)
   CLINICAL SOCIAL WORK PLACEMENT  NOTE  Date:  11/27/2016  Patient Details  Name: Jamie Spence MRN: [id number] Date of Birth: [birth year]  Clinical Social Work is seeking post-discharge placement for this patient at the [place] level of care (*CSW will initial, date and re-position this form in  chart as items are completed):  Yes   Patient/family provided with [place] Work Department's list of facilities offering this level of care within the geographic area requested by the patient (or if unable, by the patient's family).  Yes   Patient/family informed of their freedom to choose among providers that offer the needed level of care, that participate in Medicare, Medicaid or managed care program needed by the patient, have an available bed and are willing to accept the patient.  Yes   Patient/family informed of [place]'s ownership interest in [facility] and [facility], as well as of the fact that they are under no obligation to receive care at these facilities.  PASRR submitted to EDS on 11/27/16     PASRR number received on 11/27/16     Existing PASRR number confirmed on       FL2 transmitted to all facilities in geographic area requested by pt/family on 11/27/16     FL2 transmitted to all facilities within larger geographic area on       Patient informed that his/her managed care company has contracts with or will negotiate with certain facilities, including the following:            Patient/family informed of bed offers received.  Patient chooses bed at       Physician recommends and patient chooses bed at      Patient to be transferred to   on  .  Patient to be transferred to facility by       Patient family notified on   of transfer.  Name of family member notified:        PHYSICIAN       Additional Comment:    _______________________________________________ Danie Chandler, [place]  Work 11/27/2016, 2:37 PM

## 2016-11-28 LAB — GASTROINTESTINAL PANEL BY PCR, STOOL (REPLACES STOOL CULTURE)
ADENOVIRUS F40/41: NOT DETECTED
Astrovirus: NOT DETECTED
CAMPYLOBACTER SPECIES: NOT DETECTED
CYCLOSPORA CAYETANENSIS: NOT DETECTED
Cryptosporidium: NOT DETECTED
ENTAMOEBA HISTOLYTICA: NOT DETECTED
ENTEROPATHOGENIC E COLI (EPEC): NOT DETECTED
Enteroaggregative E coli (EAEC): NOT DETECTED
Enterotoxigenic E coli (ETEC): NOT DETECTED
Giardia lamblia: NOT DETECTED
Norovirus GI/GII: NOT DETECTED
PLESIMONAS SHIGELLOIDES: NOT DETECTED
Rotavirus A: NOT DETECTED
Salmonella species: NOT DETECTED
Sapovirus (I, II, IV, and V): DETECTED — AB
Shiga like toxin producing E coli (STEC): NOT DETECTED
Shigella/Enteroinvasive E coli (EIEC): NOT DETECTED
VIBRIO CHOLERAE: NOT DETECTED
VIBRIO SPECIES: NOT DETECTED
YERSINIA ENTEROCOLITICA: NOT DETECTED

## 2016-11-28 LAB — C DIFFICILE QUICK SCREEN W PCR REFLEX
C DIFFICILE (CDIFF) INTERP: NOT DETECTED
C Diff antigen: NEGATIVE
C Diff toxin: NEGATIVE

## 2016-11-28 MED ORDER — OXYCODONE HCL 5 MG PO TABS: 5.0000 mg | ORAL_TABLET | Freq: Four times a day (QID) | ORAL | 0 refills | Status: AC | PRN

## 2016-11-28 MED ORDER — ONDANSETRON HCL 4 MG PO TABS: 4.0000 mg | ORAL_TABLET | Freq: Four times a day (QID) | ORAL | 0 refills | Status: DC | PRN

## 2016-11-28 MED ORDER — ONDANSETRON HCL 4 MG PO TABS: 4.0000 mg | ORAL_TABLET | Freq: Four times a day (QID) | ORAL | 0 refills | Status: AC | PRN

## 2016-11-28 NOTE — Progress Notes (Signed)
Patient and her granddaughter Jamie Spence are refusing SNF. RN case manager aware of above.   [employer], LCSW [phone]

## 2016-11-28 NOTE — Care Management Important Message (Signed)
Important Message  Patient Details  Name: Jamie Spence MRN: [id number] Date of Birth: 03/02/1926   Medicare Important Message Given:  Yes    Jolly Mango, RN 11/28/2016, 9:35 AM

## 2016-11-28 NOTE — Discharge Summary (Signed)
Discharge summary [facility] [place] NAME: Jamie Spence    MR#:  [id number]  DATE OF BIRTH:  [birth year]  DATE OF ADMISSION:  11/26/2016 ADMITTING PHYSICIAN: Saundra Shelling, MD  DATE OF DISCHARGE: 11/28/2016 12:33 PM  PRIMARY CARE PHYSICIAN: Juluis Pitch, MD    ADMISSION DIAGNOSIS:  Dehydration [E86.0] Flu-like symptoms [R68.89] Nausea vomiting and diarrhea [R11.2, R19.7]  DISCHARGE DIAGNOSIS:  Active Problems:   Enteritis   SECONDARY DIAGNOSIS:   Past Medical History:  Diagnosis Date  . Cancer ([place])    uterine  . Carotid artery disease ([place])   . Hyperlipemia   . Hypertension   . Osteopenia   . Peripheral vascular disease ([place])     HOSPITAL COURSE:   1. Viral gastroenteritis with nausea, vomiting, abdominal pain and diarrhea. Stool studies were not reported until the patient went home. Positive for sapovirus.  When necessary nausea and pain medications prescribed on discharge only small supply. Patient doing better and tolerating diet upon discharge. 2. Hypokalemia replaced during the hospital course and IV fluids 3. Peripheral vascular disease and atrial fibrillation on sotalol and eliquis. 4. Hyperlipidemia unspecified on atorvastatin 5. Abdominal aortic aneurysm seen on CT scan measuring 3.6 cm. Recommend follow-up as outpatient. The patient states that she follows up with the vascular surgeons for her carotid arteries and can follow up with them for her abdominal aortic aneurysm. 6. Weakness. The patient refused home health.  DISCHARGE CONDITIONS:   Satisfactory  CONSULTS OBTAINED:   none  DRUG ALLERGIES:  No Known Allergies  DISCHARGE MEDICATIONS:   Discharge Medication List as of 11/28/2016 11:56 AM    START taking these medications   Details  oxyCODONE (OXY IR/ROXICODONE) 5 MG immediate release tablet Take 1 tablet (5 mg total) by mouth every 6 (six) hours as needed for moderate pain., Starting Wed  11/28/2016, Print      CONTINUE these medications which have CHANGED   Details  ondansetron (ZOFRAN) 4 MG tablet Take 1 tablet (4 mg total) by mouth every 6 (six) hours as needed for nausea., Starting Wed 11/28/2016, Print      CONTINUE these medications which have NOT CHANGED   Details  apixaban (ELIQUIS) 2.5 MG TABS tablet Take 1 tablet (2.5 mg total) by mouth 2 (two) times daily., Starting Tue 01/24/2016, Normal    calcium gluconate 500 MG tablet Take 1 tablet by mouth 3 (three) times daily., Historical Med    cholecalciferol (VITAMIN D) 1000 UNITS tablet Take 5,000 Units by mouth daily. , Historical Med    sotalol (BETAPACE) 80 MG tablet Take 0.5 tablets (40 mg total) by mouth 2 (two) times daily., Starting Tue 01/24/2016, Normal    atorvastatin (LIPITOR) 10 MG tablet Take 10 mg by mouth daily., Historical Med    ferrous sulfate (FER-IN-SOL) 75 (15 FE) MG/ML SOLN Take 65 mg by mouth., Historical Med    magnesium oxide (MAG-OX) 400 MG tablet Take 400 mg by mouth daily., Historical Med    nicotine (NICODERM CQ - DOSED IN MG/24 HOURS) 21 mg/24hr patch Place 1 patch (21 mg total) onto the skin daily., Starting Tue 01/24/2016, Normal      STOP taking these medications     baclofen (LIORESAL) 10 MG tablet      docusate sodium (COLACE) 100 MG capsule          DISCHARGE INSTRUCTIONS:   Follow-up PMD one week  If you experience worsening of your admission symptoms, develop shortness of breath,  life threatening emergency, suicidal or homicidal thoughts you must seek medical attention immediately by calling 911 or calling your MD immediately  if symptoms less severe.  You Must read complete instructions/literature along with all the possible adverse reactions/side effects for all the Medicines you take and that have been prescribed to you. Take any new Medicines after you have completely understood and accept all the possible adverse reactions/side effects.   Please note  You were  cared for by a hospitalist during your hospital stay. If you have any questions about your discharge medications or the care you received while you were in the hospital after you are discharged, you can call the unit and asked to speak with the hospitalist on call if the hospitalist that took care of you is not available. Once you are discharged, your primary care physician will handle any further medical issues. Please note that NO REFILLS for any discharge medications will be authorized once you are discharged, as it is imperative that you return to your primary care physician (or establish a relationship with a primary care physician if you do not have one) for your aftercare needs so that they can reassess your need for medications and monitor your lab values.    Today   CHIEF COMPLAINT:   Chief Complaint  Patient presents with  . Emesis  . Diarrhea  . Abdominal Pain    HISTORY OF PRESENT ILLNESS:  Jamie Spence  is a 83 y.o. female presented with abdominal pain and nausea vomiting diarrhea   VITAL SIGNS:  Blood pressure (!) 107/57, pulse 62, temperature 98.1 F (36.7 C), temperature source Oral, resp. rate (!) 24, height 5\' 3"  (1.6 m), weight 55.9 kg (123 lb 4.8 oz), SpO2 92 %. Respiration 16 upon discharge not 24   PHYSICAL EXAMINATION:  GENERAL:  83 y.o.-year-old patient lying in the bed with no acute distress.  EYES: Pupils equal, round, reactive to light and accommodation. No scleral icterus. Extraocular muscles intact.  HEENT: Head atraumatic, normocephalic. Oropharynx and nasopharynx clear.  NECK:  Supple, no jugular venous distention. No thyroid enlargement, no tenderness.  LUNGS: Normal breath sounds bilaterally, no wheezing, rales,rhonchi or crepitation. No use of accessory muscles of respiration.  CARDIOVASCULAR: S1, S2 normal. No murmurs, rubs, or gallops.  ABDOMEN: Soft, Slight abdominal generalized tenderness, non-distended. Bowel sounds present. No organomegaly or  mass.  EXTREMITIES: No pedal edema, cyanosis, or clubbing.  NEUROLOGIC: Cranial nerves II through XII are intact. Muscle strength 5/5 in all extremities. Sensation intact. Gait not checked.  PSYCHIATRIC: The patient is alert and oriented x 3.  SKIN: No obvious rash, lesion, or ulcer.   DATA REVIEW:   CBC  Recent Labs Lab 11/27/16 0311  WBC 12.2*  HGB 11.5*  HCT 34.0*  PLT 144*    Chemistries   Recent Labs Lab 11/26/16 0134 11/27/16 0311  NA 135 131*  K 3.6 4.7  CL 102 103  CO2 25 23  GLUCOSE 173* 100*  BUN 17 13  CREATININE 0.98 0.81  CALCIUM 8.8* 7.3*  AST 52*  --   ALT 33  --   ALKPHOS 48  --   BILITOT 0.7  --      Microbiology Results  Results for orders placed or performed during the hospital encounter of 11/26/16  Blood Culture (routine x 2)     Status: None (Preliminary result)   Collection Time: 11/26/16  3:09 AM  Result Value Ref Range Status   Specimen Description BLOOD LEFT ANTECUBITAL  Final   Special Requests   Final    BOTTLES DRAWN AEROBIC AND ANAEROBIC BCHV HIGH VOLUME   Culture NO GROWTH 2 DAYS  Final   Report Status PENDING  Incomplete  Blood Culture (routine x 2)     Status: None (Preliminary result)   Collection Time: 11/26/16  3:09 AM  Result Value Ref Range Status   Specimen Description BLOOD RIGHT WRIST  Final   Special Requests   Final    BOTTLES DRAWN AEROBIC AND ANAEROBIC BCAV ADEQUATE VOLUME   Culture NO GROWTH 2 DAYS  Final   Report Status PENDING  Incomplete  Gastrointestinal Panel by PCR , Stool     Status: Abnormal   Collection Time: 11/26/16  7:19 AM  Result Value Ref Range Status   Campylobacter species NOT DETECTED NOT DETECTED Final   Plesimonas shigelloides NOT DETECTED NOT DETECTED Final   Salmonella species NOT DETECTED NOT DETECTED Final   Yersinia enterocolitica NOT DETECTED NOT DETECTED Final   Vibrio species NOT DETECTED NOT DETECTED Final   Vibrio cholerae NOT DETECTED NOT DETECTED Final   Enteroaggregative E  coli (EAEC) NOT DETECTED NOT DETECTED Final   Enteropathogenic E coli (EPEC) NOT DETECTED NOT DETECTED Final   Enterotoxigenic E coli (ETEC) NOT DETECTED NOT DETECTED Final   Shiga like toxin producing E coli (STEC) NOT DETECTED NOT DETECTED Final   Shigella/Enteroinvasive E coli (EIEC) NOT DETECTED NOT DETECTED Final   Cryptosporidium NOT DETECTED NOT DETECTED Final   Cyclospora cayetanensis NOT DETECTED NOT DETECTED Final   Entamoeba histolytica NOT DETECTED NOT DETECTED Final   Giardia lamblia NOT DETECTED NOT DETECTED Final   Adenovirus F40/41 NOT DETECTED NOT DETECTED Final   Astrovirus NOT DETECTED NOT DETECTED Final   Norovirus GI/GII NOT DETECTED NOT DETECTED Final   Rotavirus A NOT DETECTED NOT DETECTED Final   Sapovirus (I, II, IV, and V) DETECTED (A) NOT DETECTED Final  C difficile quick scan w PCR reflex     Status: None   Collection Time: 11/26/16  7:19 AM  Result Value Ref Range Status   C Diff antigen NEGATIVE NEGATIVE Final   C Diff toxin NEGATIVE NEGATIVE Final   C Diff interpretation No C. difficile detected.  Final    Management plans discussed with the patient, family and they are in agreement.  CODE STATUS:  Code Status History    Date Active Date Inactive Code Status Order ID Comments User Context   11/26/2016  6:01 AM 11/28/2016  3:39 PM Full Code [id number]  Saundra Shelling, MD Inpatient   01/20/2016  3:12 PM 01/24/2016  5:13 PM Full Code [id number]  Nicholes Mango, MD Inpatient   03/09/2015 12:11 PM 03/10/2015  4:18 PM Full Code [id number]  Algernon Huxley, MD Inpatient    Advance Directive Documentation     Most Recent Value  Type of Advance Directive  Healthcare Power of [place], Living will  Pre-existing out of facility DNR order (yellow form or pink MOST form)  -  "MOST" Form in Place?  -      TOTAL TIME TAKING CARE OF THIS PATIENT: 35 minutes.    Loletha Grayer M.D on 11/28/2016 at 5:28 PM  Between 7am to 6pm - Pager - [phone]  After 6pm go to  [email or web address] - password [email or web address] [place] [facility]  [phone]  CC: Primary care physician; Juluis Pitch, MD

## 2016-11-28 NOTE — Care Management Note (Signed)
Case Management Note  Patient Details  Name: MARCAYLA BUDGE MRN: [id number] Date of Birth: 07/29/1926  Subjective/Objective:   Spoke with granddaughter, Sherlyn Lees [phone]) by phone. Patient lives with her. Ms. Baruch Goldmann reports patient is normally completely independent, active, drives, goes to [employer] and church all week.  She uses no DME at baseline. Patient has become weak with recent illness and will be home bound until she gets stronger. Discussed home health PT and Ms. [place] is agreeable. Referral to [facility] for [facility] health PT.   PCP is Dr. Lovie Macadamia.               Action/Plan: HHPT with [facility]. It is anticipated patient will discharge later today.  Expected Discharge Date:  11/28/16               Expected Discharge Plan:  [place]  In-House Referral:     Discharge planning Services  CM Consult  Post Acute Care Choice:  Home Health Choice offered to:  [facility] POA / Guardian  DME Arranged:    DME Agency:     HH Arranged:  PT [place]:  [place]  Status of Service:  Completed, signed off  If discussed at [place] of Stay Meetings, dates discussed:    Additional Comments:  Jolly Mango, RN 11/28/2016, 9:26 AM

## 2016-12-01 LAB — CULTURE, BLOOD (ROUTINE X 2)
CULTURE: NO GROWTH
Culture: NO GROWTH
SPECIAL REQUESTS: ADEQUATE

## 2016-12-26 ENCOUNTER — Other Ambulatory Visit: Payer: Self-pay | Admitting: Family Medicine

## 2016-12-26 DIAGNOSIS — Z1231 Encounter for screening mammogram for malignant neoplasm of breast: Secondary | ICD-10-CM

## 2017-01-21 ENCOUNTER — Ambulatory Visit: Payer: Medicare Other

## 2017-01-28 ENCOUNTER — Ambulatory Visit
Admission: RE | Admit: 2017-01-28 | Discharge: 2017-01-28 | Disposition: A | Discharge status: Home / Self Care | Payer: Medicare Other | Source: Ambulatory Visit | Attending: Family Medicine | Admitting: Family Medicine

## 2017-01-28 DIAGNOSIS — Z1231 Encounter for screening mammogram for malignant neoplasm of breast: Secondary | ICD-10-CM | POA: Diagnosis not present

## 2017-04-07 DIAGNOSIS — E785 Hyperlipidemia, unspecified: Secondary | ICD-10-CM | POA: Insufficient documentation

## 2017-04-07 NOTE — Progress Notes (Signed)
MRN : [id number]  Jamie Spence is a 83 y.o. (1926/07/03) female who presents with chief complaint of No chief complaint on file. Marland Kitchen  History of Present Illness: The patient is seen for follow up evaluation of carotid stenosis. The carotid stenosis followed by ultrasound.   The patient denies amaurosis fugax. There is no recent history of TIA symptoms or focal motor deficits. There is no prior documented CVA.  The patient is taking enteric-coated aspirin 81 mg daily.  There is no history of migraine headaches. There is no history of seizures.  She is also now complaining of an ulcer of the right ankle that has been present for several months.  She denies rest pain and notes that her claudication has been stable but she doesn't walk very much.   The patient has a history of coronary artery disease, no recent episodes of angina or shortness of breath. There is a history of hyperlipidemia which is being treated with a statin.    Carotid Duplex done today shows 60-79% RICA and widely patent stent in the LICA.  No change compared to last study in 04/02/2016  No outpatient prescriptions have been marked as taking for the 04/08/17 encounter (Appointment) with Delana Meyer, Dolores Lory, MD.    Past Medical History:  Diagnosis Date  . Cancer ([place])    uterine  . Carotid artery disease ([place])   . Hyperlipemia   . Hypertension   . Osteopenia   . Peripheral vascular disease [facility])     Past Surgical History:  Procedure Laterality Date  . PACEMAKER INSERTION Left 01/23/2016   Procedure: INSERTION PACEMAKER;  Surgeon: Isaias Cowman, MD;  Location: [facility] ORS;  Service: Cardiovascular;  Laterality: Left;  . PERIPHERAL VASCULAR CATHETERIZATION Left 03/09/2015   Procedure: Carotid PTA/Stent Intervention;  Surgeon: [name], MD;  Location: [place] [facility];  Service: Cardiovascular;  Laterality: Left;  . PERIPHERAL VASCULAR CATHETERIZATION  03/09/2015   Procedure: Carotid Angiography;   Surgeon: [name], MD;  Location: [place] [facility];  Service: Cardiovascular;;    Social History Social History  Substance Use Topics  . Smoking status: Current Every Day Smoker    Packs/day: 1.00    Years: 60.00  . Smokeless tobacco: Never Used  . Alcohol use No    Family History Family History  Problem Relation Age of Onset  . Diabetes Neg Hx     No Known Allergies   REVIEW OF SYSTEMS (Negative unless checked)  Constitutional: [] Weight loss  [] Fever  [] Chills Cardiac: [] Chest pain   [] Chest pressure   [] Palpitations   [] Shortness of breath when laying flat   [] Shortness of breath with exertion. Vascular:  [] Pain in legs with walking   [] Pain in legs at rest  [] History of DVT   [] Phlebitis   [] Swelling in legs   [] Varicose veins   [] Non-healing ulcers Pulmonary:   [] Uses home oxygen   [] Productive cough   [] Hemoptysis   [] Wheeze  [] COPD   [] Asthma Neurologic:  [] Dizziness   [] Seizures   [] History of stroke   [] History of TIA  [] Aphasia   [] Vissual changes   [] Weakness or numbness in arm   [] Weakness or numbness in leg Musculoskeletal:   [] Joint swelling   [] Joint pain   [] Low back pain Hematologic:  [] Easy bruising  [] Easy bleeding   [] Hypercoagulable state   [] Anemic Gastrointestinal:  [] Diarrhea   [] Vomiting  [] Gastroesophageal reflux/heartburn   [] Difficulty swallowing. Genitourinary:  [] Chronic kidney disease   [] Difficult urination  []   Frequent urination   [] Blood in urine Skin:  [] Rashes   [] Ulcers  Psychological:  [] History of anxiety   []  History of major depression.  Physical Examination  There were no vitals filed for this visit. There is no height or weight on file to calculate BMI. Gen: WD/WN, NAD Head: [place]/AT, No temporalis wasting.  Ear/Nose/Throat: Hearing grossly intact, nares w/o erythema or drainage Eyes: PER, EOMI, sclera nonicteric.  Neck: Supple, no large masses.   Pulmonary:  Good air movement, no audible wheezing bilaterally, no use of  accessory muscles.  Cardiac: RRR, no JVD Vascular:  Right ankle ulcer noninfected, 2+ edema Vessel Right Left  Radial Palpable Palpable  PT Not Palpable Not Palpable  DP Not Palpable Not Palpable  Gastrointestinal: Non-distended. No guarding/no peritoneal signs.  Musculoskeletal: M/S 5/5 throughout.  No deformity or atrophy.  Neurologic: CN 2-12 intact. Symmetrical.  Speech is fluent. Motor exam as listed above. Psychiatric: Judgment intact, Mood & affect appropriate for pt's clinical situation. Dermatologic: No rashes or ulcers noted.  No changes consistent with cellulitis. Lymph : No lichenification or skin changes of chronic lymphedema.  CBC Lab Results  Component Value Date   WBC 12.2 (H) 11/27/2016   HGB 11.5 (L) 11/27/2016   HCT 34.0 (L) 11/27/2016   MCV 100.3 (H) 11/27/2016   PLT 144 (L) 11/27/2016    BMET    Component Value Date/Time   NA 131 (L) 11/27/2016 0311   NA 137 01/16/2014 1704   K 4.7 11/27/2016 0311   K 4.4 01/16/2014 1704   CL 103 11/27/2016 0311   CL 104 01/16/2014 1704   CO2 23 11/27/2016 0311   CO2 27 01/16/2014 1704   GLUCOSE 100 (H) 11/27/2016 0311   GLUCOSE 89 01/16/2014 1704   BUN 13 11/27/2016 0311   BUN 21 (H) 01/16/2014 1704   CREATININE 0.81 11/27/2016 0311   CREATININE 1.04 01/16/2014 1704   CALCIUM 7.3 (L) 11/27/2016 0311   CALCIUM 8.5 01/16/2014 1704   GFRNONAA >60 11/27/2016 0311   GFRNONAA 49 (L) 01/16/2014 1704   GFRAA >60 11/27/2016 0311   GFRAA 57 (L) 01/16/2014 1704   CrCl cannot be calculated (Patient's most recent lab result is older than the maximum 21 days allowed.).  COAG No results found for: INR, PROTIME  Radiology No results found.  Assessment/Plan 1. Bilateral carotid artery stenosis The patient is seen for follow up evaluation of carotid stenosis. The carotid stenosis followed by ultrasound.   The patient denies amaurosis fugax. There is no recent history of TIA symptoms or focal motor deficits. There is no  prior documented CVA.  The patient is taking enteric-coated aspirin 81 mg daily.  There is no history of migraine headaches. There is no history of seizures.  The patient has a history of coronary artery disease, no recent episodes of angina or shortness of breath. The patient denies PAD or claudication symptoms. There is a history of hyperlipidemia which is being treated with a statin.    No change compared to last study   2. Skin ulcer of right ankle with fat layer exposed ([place]) See #3  3. Atherosclerosis of native arteries of the extremities with ulceration ([place]) Recommend:  Patient should undergo arterial duplex of the lower extremity ASAP because there has been a significant deterioration in the patient's lower extremity symptoms.  The patient states they are having increased pain and a marked decrease in the distance that they can walk.  The risks and benefits as well as  the alternatives were discussed in detail with the patient.  All questions were answered.  Patient agrees to proceed and understands this could be a prelude to angiography and intervention.  The patient will follow up with me in the office to review the studies.    A total of 30 minutes was spent with this patient and greater than 50% was spent in counseling and coordination of care with the patient.  Discussion included the treatment options for vascular disease including indications for surgery and intervention.  Also discussed is the appropriate timing of treatment.  In addition medical therapy was discussed.  4. Mixed hyperlipidemia Continue statin as ordered and reviewed, no changes at this time   5. Gastroesophageal reflux disease without esophagitis Continue PPI as already ordered, these medications have been reviewed and there are no changes at this time.      Hortencia Pilar, MD  04/07/2017 5:11 PM

## 2017-04-08 ENCOUNTER — Encounter (INDEPENDENT_AMBULATORY_CARE_PROVIDER_SITE_OTHER): Payer: Self-pay | Admitting: Vascular Surgery

## 2017-04-08 ENCOUNTER — Other Ambulatory Visit (INDEPENDENT_AMBULATORY_CARE_PROVIDER_SITE_OTHER): Payer: Self-pay | Admitting: Vascular Surgery

## 2017-04-08 ENCOUNTER — Ambulatory Visit (INDEPENDENT_AMBULATORY_CARE_PROVIDER_SITE_OTHER): Payer: Medicare Other | Admitting: Vascular Surgery

## 2017-04-08 ENCOUNTER — Other Ambulatory Visit (INDEPENDENT_AMBULATORY_CARE_PROVIDER_SITE_OTHER): Payer: Medicare Other

## 2017-04-08 VITALS — BP 120/57 | HR 60 | Resp 14 | Ht 63.0 in | Wt 112.0 lb

## 2017-04-08 DIAGNOSIS — I7025 Atherosclerosis of native arteries of other extremities with ulceration: Secondary | ICD-10-CM

## 2017-04-08 DIAGNOSIS — I6523 Occlusion and stenosis of bilateral carotid arteries: Secondary | ICD-10-CM | POA: Diagnosis not present

## 2017-04-08 DIAGNOSIS — L97312 Non-pressure chronic ulcer of right ankle with fat layer exposed: Secondary | ICD-10-CM

## 2017-04-08 DIAGNOSIS — E782 Mixed hyperlipidemia: Secondary | ICD-10-CM

## 2017-04-08 DIAGNOSIS — K219 Gastro-esophageal reflux disease without esophagitis: Secondary | ICD-10-CM | POA: Diagnosis not present

## 2017-04-09 ENCOUNTER — Other Ambulatory Visit (INDEPENDENT_AMBULATORY_CARE_PROVIDER_SITE_OTHER): Payer: Self-pay | Admitting: Vascular Surgery

## 2017-04-09 DIAGNOSIS — I739 Peripheral vascular disease, unspecified: Secondary | ICD-10-CM

## 2017-04-10 ENCOUNTER — Ambulatory Visit (INDEPENDENT_AMBULATORY_CARE_PROVIDER_SITE_OTHER): Payer: Medicare Other

## 2017-04-10 DIAGNOSIS — I739 Peripheral vascular disease, unspecified: Secondary | ICD-10-CM | POA: Diagnosis not present

## 2017-04-10 DIAGNOSIS — I7025 Atherosclerosis of native arteries of other extremities with ulceration: Secondary | ICD-10-CM | POA: Insufficient documentation

## 2017-04-10 DIAGNOSIS — K219 Gastro-esophageal reflux disease without esophagitis: Secondary | ICD-10-CM | POA: Insufficient documentation

## 2017-04-10 DIAGNOSIS — L97309 Non-pressure chronic ulcer of unspecified ankle with unspecified severity: Secondary | ICD-10-CM | POA: Insufficient documentation

## 2017-05-20 ENCOUNTER — Ambulatory Visit (INDEPENDENT_AMBULATORY_CARE_PROVIDER_SITE_OTHER): Payer: Medicare Other | Admitting: Vascular Surgery

## 2017-05-20 ENCOUNTER — Encounter (INDEPENDENT_AMBULATORY_CARE_PROVIDER_SITE_OTHER): Payer: Self-pay | Admitting: Vascular Surgery

## 2017-05-20 VITALS — BP 95/57 | HR 62 | Resp 16 | Wt 110.0 lb

## 2017-05-20 DIAGNOSIS — I70219 Atherosclerosis of native arteries of extremities with intermittent claudication, unspecified extremity: Secondary | ICD-10-CM

## 2017-05-20 DIAGNOSIS — E782 Mixed hyperlipidemia: Secondary | ICD-10-CM

## 2017-05-20 DIAGNOSIS — K219 Gastro-esophageal reflux disease without esophagitis: Secondary | ICD-10-CM | POA: Diagnosis not present

## 2017-05-20 DIAGNOSIS — I6523 Occlusion and stenosis of bilateral carotid arteries: Secondary | ICD-10-CM

## 2017-05-20 DIAGNOSIS — I714 Abdominal aortic aneurysm, without rupture, unspecified: Secondary | ICD-10-CM

## 2017-05-20 DIAGNOSIS — I499 Cardiac arrhythmia, unspecified: Secondary | ICD-10-CM | POA: Diagnosis not present

## 2017-05-20 NOTE — Progress Notes (Signed)
MRN : [id number]  Jamie Spence is a 83 y.o. (1925/11/28) female who presents with chief complaint of  Chief Complaint  Patient presents with  . Follow-up    ultrasound results  .  History of Present Illness: The patient returns to the office for followup and review of the noninvasive studies. There have been no interval changes in lower extremity symptoms. No interval shortening of the patient's claudication distance or development of rest pain symptoms. No new ulcers or wounds have occurred since the last visit.  There have been no significant changes to the patient's overall health care.  The patient denies amaurosis fugax or recent TIA symptoms. There are no recent neurological changes noted.  She is s/p left ICA stent on 03/09/2015.  No complications with that procedure.   The patient denies history of DVT, PE or superficial thrombophlebitis. The patient denies recent episodes of angina or shortness of breath.   ABI Rt=0.85 and Lt=0.84   Duplex ultrasound of the carotid arteries shows 60-79% RICA stenosis and widely patent LICA stent  Current Meds  Medication Sig  . apixaban (ELIQUIS) 2.5 MG TABS tablet Take 1 tablet (2.5 mg total) by mouth 2 (two) times daily.  Marland Kitchen aspirin EC 81 MG tablet Take by mouth.  Marland Kitchen atorvastatin (LIPITOR) 10 MG tablet Take 10 mg by mouth daily.  . calcium gluconate 500 MG tablet Take 1 tablet by mouth 3 (three) times daily.  . cholecalciferol (VITAMIN D) 1000 UNITS tablet Take 5,000 Units by mouth daily.   . Cyanocobalamin 100 MCG LOZG Take by mouth.  . docusate sodium (COLACE) 100 MG capsule Take by mouth.  . ferrous sulfate (FER-IN-SOL) 75 (15 FE) MG/ML SOLN Take 65 mg by mouth.  Marland Kitchen HYDROcodone-acetaminophen (NORCO/VICODIN) 5-325 MG tablet Take by mouth.  . magnesium oxide (MAG-OX) 400 MG tablet Take 400 mg by mouth daily.  . nicotine (NICODERM CQ - DOSED IN MG/24 HOURS) 21 mg/24hr patch Place 1 patch (21 mg total) onto the skin daily.  Marland Kitchen  omeprazole (PRILOSEC) 20 MG capsule Take by mouth.  . ondansetron (ZOFRAN) 4 MG tablet Take 1 tablet (4 mg total) by mouth every 6 (six) hours as needed for nausea.  Marland Kitchen oxyCODONE (OXY IR/ROXICODONE) 5 MG immediate release tablet Take 1 tablet (5 mg total) by mouth every 6 (six) hours as needed for moderate pain.  Marland Kitchen Potassium 99 MG TABS Take by mouth.  . sotalol (BETAPACE) 80 MG tablet Take 0.5 tablets (40 mg total) by mouth 2 (two) times daily.    Past Medical History:  Diagnosis Date  . Cancer ([place])    uterine  . Carotid artery disease ([place])   . Hyperlipemia   . Hypertension   . Osteopenia   . Peripheral vascular disease [facility])     Past Surgical History:  Procedure Laterality Date  . PACEMAKER INSERTION Left 01/23/2016   Procedure: INSERTION PACEMAKER;  Surgeon: Isaias Cowman, MD;  Location: [facility] ORS;  Service: Cardiovascular;  Laterality: Left;  . PERIPHERAL VASCULAR CATHETERIZATION Left 03/09/2015   Procedure: Carotid PTA/Stent Intervention;  Surgeon: [name], MD;  Location: [place] [facility];  Service: Cardiovascular;  Laterality: Left;  . PERIPHERAL VASCULAR CATHETERIZATION  03/09/2015   Procedure: Carotid Angiography;  Surgeon: [name], MD;  Location: [place] [facility];  Service: Cardiovascular;;    Social History Social History  Substance Use Topics  . Smoking status: Current Every Day Smoker    Packs/day: 1.00    Years: 60.00  .  Smokeless tobacco: Never Used  . Alcohol use No    Family History Family History  Problem Relation Age of Onset  . Diabetes Neg Hx     No Known Allergies   REVIEW OF SYSTEMS (Negative unless checked)  Constitutional: [] Weight loss  [] Fever  [] Chills Cardiac: [] Chest pain   [] Chest pressure   [] Palpitations   [] Shortness of breath when laying flat   [] Shortness of breath with exertion. Vascular:  [x] Pain in legs with walking   [] Pain in legs at rest  [] History of DVT   [] Phlebitis   [] Swelling in legs    [] Varicose veins   [] Non-healing ulcers Pulmonary:   [] Uses home oxygen   [] Productive cough   [] Hemoptysis   [] Wheeze  [x] COPD   [] Asthma Neurologic:  [] Dizziness   [] Seizures   [x] History of stroke   [] History of TIA  [] Aphasia   [] Vissual changes   [] Weakness or numbness in arm   [] Weakness or numbness in leg Musculoskeletal:   [] Joint swelling   [] Joint pain   [] Low back pain Hematologic:  [] Easy bruising  [] Easy bleeding   [] Hypercoagulable state   [] Anemic Gastrointestinal:  [] Diarrhea   [] Vomiting  [] Gastroesophageal reflux/heartburn   [] Difficulty swallowing. Genitourinary:  [] Chronic kidney disease   [] Difficult urination  [] Frequent urination   [] Blood in urine Skin:  [] Rashes   [] Ulcers  Psychological:  [] History of anxiety   []  History of major depression.  Physical Examination  Vitals:   05/20/17 1004  BP: (!) 95/57  Pulse: 62  Resp: 16  Weight: 110 lb (49.9 kg)   Body mass index is 19.49 kg/m. Gen: WD/WN, NAD Head: [place]/AT, No temporalis wasting.  Ear/Nose/Throat: Hearing grossly intact, nares w/o erythema or drainage Eyes: PER, EOMI, sclera nonicteric.  Neck: Supple, no large masses.   Pulmonary:  Good air movement, no audible wheezing bilaterally, no use of accessory muscles.  Cardiac: RRR, no JVD Vascular:  Bilateral carotid bruits Vessel Right Left  Radial Palpable Palpable  PT Not Palpable Not Palpable  DP Not Palpable Not Palpable  Gastrointestinal: Non-distended. No guarding/no peritoneal signs.  Musculoskeletal: M/S 5/5 throughout.  No deformity or atrophy.  Neurologic: CN 2-12 intact. Symmetrical.  Speech is fluent. Motor exam as listed above. Psychiatric: Judgment intact, Mood & affect appropriate for pt's clinical situation. Dermatologic: No rashes or ulcers noted.  No changes consistent with cellulitis. Lymph : No lichenification or skin changes of chronic lymphedema.  CBC Lab Results  Component Value Date   WBC 12.2 (H) 11/27/2016   HGB 11.5 (L)  11/27/2016   HCT 34.0 (L) 11/27/2016   MCV 100.3 (H) 11/27/2016   PLT 144 (L) 11/27/2016    BMET    Component Value Date/Time   NA 131 (L) 11/27/2016 0311   NA 137 01/16/2014 1704   K 4.7 11/27/2016 0311   K 4.4 01/16/2014 1704   CL 103 11/27/2016 0311   CL 104 01/16/2014 1704   CO2 23 11/27/2016 0311   CO2 27 01/16/2014 1704   GLUCOSE 100 (H) 11/27/2016 0311   GLUCOSE 89 01/16/2014 1704   BUN 13 11/27/2016 0311   BUN 21 (H) 01/16/2014 1704   CREATININE 0.81 11/27/2016 0311   CREATININE 1.04 01/16/2014 1704   CALCIUM 7.3 (L) 11/27/2016 0311   CALCIUM 8.5 01/16/2014 1704   GFRNONAA >60 11/27/2016 0311   GFRNONAA 49 (L) 01/16/2014 1704   GFRAA >60 11/27/2016 0311   GFRAA 57 (L) 01/16/2014 1704   CrCl cannot be calculated (Patient's most recent  lab result is older than the maximum 21 days allowed.).  COAG No results found for: INR, PROTIME  Radiology No results found.  Assessment/Plan 1. Bilateral carotid artery stenosis Recommend:  Given the patient's asymptomatic subcritical stenosis no further invasive testing or surgery at this time.  Duplex ultrasound shows <80% stenosis bilaterally.  Continue antiplatelet therapy as prescribed Continue management of CAD, HTN and Hyperlipidemia Healthy heart diet,  encouraged exercise at least 4 times per week Follow up in 12 months with duplex ultrasound and physical exam   - VAS US CAROTID; Future  2. 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  The patient does not voice lifestyle limiting changes at this point in time.  Noninvasive studies do not suggest clinically significant change.  No invasive studies, angiography or surgery at this time The patient should continue walking and begin a more formal exercise program.  The patient should continue antiplatelet therapy and aggressive  treatment of the lipid abnormalities  No changes in the patient's medications at this time  The patient should continue wearing graduated compression socks 10-15 mmHg strength to control the mild edema.   - VAS [place] ABI WITH/WO TBI; Future  3. AAA (abdominal aortic aneurysm) without rupture (HCC) No surgery or intervention at this time. The patient has an asymptomatic abdominal aortic aneurysm that is less than 4 cm in maximal diameter.  I have discussed the natural history of abdominal aortic aneurysm and the small risk of rupture for aneurysm less than 5 cm in size.  However, as these small aneurysms tend to enlarge over time, continued surveillance with ultrasound or CT scan is mandatory.  I have also discussed optimizing medical management with hypertension and lipid control and the importance of abstinence from tobacco.  The patient is also encouraged to exercise a minimum of 30 minutes 4 times a week.  Should the patient develop new onset abdominal or back pain or signs of peripheral embolization they are instructed to seek medical attention immediately and to alert the physician providing care that they have an aneurysm.  The patient voices their understanding. The patient will return in 12 months with an aortic duplex.  - VAS US AORTA/IVC/ILIACS; Future  4. Cardiac arrhythmia, unspecified cardiac arrhythmia type Continue antiarrhythmia medications as already ordered, these medications have been reviewed and there are no changes at this time.  Continue anticoagulation as ordered by Cardiology Service   5. Mixed hyperlipidemia Continue statin as ordered and reviewed, no changes at this time   6. Gastroesophageal reflux disease without esophagitis Continue PPI as already ordered, these medications have been reviewed and there are no changes at this time.    Hortencia Pilar, MD  05/20/2017 10:33 AM

## 2017-05-21 DIAGNOSIS — I714 Abdominal aortic aneurysm, without rupture, unspecified: Secondary | ICD-10-CM | POA: Insufficient documentation

## 2017-05-22 ENCOUNTER — Emergency Department: Payer: Medicare Other

## 2017-05-22 ENCOUNTER — Observation Stay
Admission: EM | Admit: 2017-05-22 | Discharge: 2017-05-23 | Disposition: A | Discharge status: Home / Self Care | Payer: Medicare Other | Attending: Internal Medicine | Admitting: Internal Medicine

## 2017-05-22 DIAGNOSIS — I480 Paroxysmal atrial fibrillation: Secondary | ICD-10-CM | POA: Diagnosis not present

## 2017-05-22 DIAGNOSIS — I110 Hypertensive heart disease with heart failure: Secondary | ICD-10-CM | POA: Diagnosis not present

## 2017-05-22 DIAGNOSIS — I509 Heart failure, unspecified: Secondary | ICD-10-CM | POA: Insufficient documentation

## 2017-05-22 DIAGNOSIS — Z79899 Other long term (current) drug therapy: Secondary | ICD-10-CM | POA: Insufficient documentation

## 2017-05-22 DIAGNOSIS — Z79891 Long term (current) use of opiate analgesic: Secondary | ICD-10-CM | POA: Diagnosis not present

## 2017-05-22 DIAGNOSIS — Z66 Do not resuscitate: Secondary | ICD-10-CM | POA: Insufficient documentation

## 2017-05-22 DIAGNOSIS — Z950 Presence of cardiac pacemaker: Secondary | ICD-10-CM | POA: Diagnosis not present

## 2017-05-22 DIAGNOSIS — Z7982 Long term (current) use of aspirin: Secondary | ICD-10-CM | POA: Diagnosis not present

## 2017-05-22 DIAGNOSIS — J449 Chronic obstructive pulmonary disease, unspecified: Secondary | ICD-10-CM | POA: Diagnosis not present

## 2017-05-22 DIAGNOSIS — E785 Hyperlipidemia, unspecified: Secondary | ICD-10-CM | POA: Insufficient documentation

## 2017-05-22 DIAGNOSIS — J810 Acute pulmonary edema: Principal | ICD-10-CM | POA: Insufficient documentation

## 2017-05-22 DIAGNOSIS — R079 Chest pain, unspecified: Secondary | ICD-10-CM | POA: Diagnosis not present

## 2017-05-22 DIAGNOSIS — Z7901 Long term (current) use of anticoagulants: Secondary | ICD-10-CM | POA: Insufficient documentation

## 2017-05-22 DIAGNOSIS — I350 Nonrheumatic aortic (valve) stenosis: Secondary | ICD-10-CM | POA: Diagnosis not present

## 2017-05-22 DIAGNOSIS — C55 Malignant neoplasm of uterus, part unspecified: Secondary | ICD-10-CM | POA: Insufficient documentation

## 2017-05-22 DIAGNOSIS — Z87891 Personal history of nicotine dependence: Secondary | ICD-10-CM | POA: Diagnosis not present

## 2017-05-22 LAB — CBC
HEMATOCRIT: 37.5 % (ref 35.0–47.0)
HEMOGLOBIN: 13.1 g/dL (ref 12.0–16.0)
MCH: 34.7 pg — ABNORMAL HIGH (ref 26.0–34.0)
MCHC: 34.9 g/dL (ref 32.0–36.0)
MCV: 99.3 fL (ref 80.0–100.0)
PLATELETS: 199 10*3/uL (ref 150–440)
RBC: 3.78 MIL/uL — AB (ref 3.80–5.20)
RDW: 14.5 % (ref 11.5–14.5)
WBC: 8.6 10*3/uL (ref 3.6–11.0)

## 2017-05-22 LAB — BASIC METABOLIC PANEL
ANION GAP: 7 (ref 5–15)
BUN: 16 mg/dL (ref 6–20)
CO2: 31 mmol/L (ref 22–32)
Calcium: 9.3 mg/dL (ref 8.9–10.3)
Chloride: 100 mmol/L — ABNORMAL LOW (ref 101–111)
Creatinine, Ser: 0.85 mg/dL (ref 0.44–1.00)
GFR calc non Af Amer: 59 mL/min — ABNORMAL LOW (ref >60)
Glucose, Bld: 116 mg/dL — ABNORMAL HIGH (ref 65–99)
POTASSIUM: 4.7 mmol/L (ref 3.5–5.1)
SODIUM: 138 mmol/L (ref 135–145)

## 2017-05-22 LAB — TROPONIN I
Troponin I: 0.05 ng/mL (ref <0.03)
Troponin I: 0.07 ng/mL (ref <0.03)
Troponin I: 0.08 ng/mL (ref <0.03)
Troponin I: 0.07 ng/mL (ref <0.03)

## 2017-05-22 LAB — HEMOGLOBIN A1C
Hgb A1c MFr Bld: 5.1 % (ref 4.8–5.6)
Mean Plasma Glucose: 99.67 mg/dL

## 2017-05-22 LAB — TSH: TSH: 1.276 u[IU]/mL (ref 0.350–4.500)

## 2017-05-22 LAB — BRAIN NATRIURETIC PEPTIDE: B Natriuretic Peptide: 1306 pg/mL — ABNORMAL HIGH (ref 0.0–100.0)

## 2017-05-22 MED ORDER — FUROSEMIDE 10 MG/ML IJ SOLN
40.0000 mg | Freq: Once | INTRAMUSCULAR | Status: AC
  Administered 2017-05-22: 40 mg via INTRAVENOUS
  Filled 2017-05-22: qty 4

## 2017-05-22 MED ORDER — ENSURE ENLIVE PO LIQD
237.0000 mL | Freq: Two times a day (BID) | ORAL | Status: DC
  Administered 2017-05-22 – 2017-05-23 (×3): 237 mL via ORAL

## 2017-05-22 MED ORDER — CALCIUM GLUCONATE 500 MG PO TABS
1.0000 | ORAL_TABLET | Freq: Three times a day (TID) | ORAL | Status: DC
  Filled 2017-05-22: qty 1

## 2017-05-22 MED ORDER — FERROUS SULFATE 325 (65 FE) MG PO TABS
325.0000 mg | ORAL_TABLET | Freq: Every day | ORAL | Status: DC
  Administered 2017-05-22 – 2017-05-23 (×2): 325 mg via ORAL
  Filled 2017-05-22 (×2): qty 1

## 2017-05-22 MED ORDER — CYCLOBENZAPRINE HCL 10 MG PO TABS
5.0000 mg | ORAL_TABLET | Freq: Three times a day (TID) | ORAL | Status: DC | PRN
  Administered 2017-05-22 (×2): 5 mg via ORAL
  Filled 2017-05-22 (×2): qty 1

## 2017-05-22 MED ORDER — OXYCODONE HCL 5 MG PO TABS
5.0000 mg | ORAL_TABLET | Freq: Once | ORAL | Status: DC
  Filled 2017-05-22: qty 1

## 2017-05-22 MED ORDER — DOCUSATE SODIUM 100 MG PO CAPS
100.0000 mg | ORAL_CAPSULE | Freq: Two times a day (BID) | ORAL | Status: DC
  Administered 2017-05-22 – 2017-05-23 (×3): 100 mg via ORAL
  Filled 2017-05-22 (×3): qty 1

## 2017-05-22 MED ORDER — ONDANSETRON HCL 4 MG PO TABS: 4.0000 mg | ORAL_TABLET | Freq: Four times a day (QID) | ORAL | Status: DC | PRN

## 2017-05-22 MED ORDER — FUROSEMIDE 10 MG/ML IJ SOLN
20.0000 mg | Freq: Two times a day (BID) | INTRAMUSCULAR | Status: DC
  Filled 2017-05-22: qty 2

## 2017-05-22 MED ORDER — ONDANSETRON HCL 4 MG/2ML IJ SOLN: 4.0000 mg | Freq: Four times a day (QID) | INTRAMUSCULAR | Status: DC | PRN

## 2017-05-22 MED ORDER — ACETAMINOPHEN 325 MG PO TABS: 650.0000 mg | ORAL_TABLET | Freq: Four times a day (QID) | ORAL | Status: DC | PRN

## 2017-05-22 MED ORDER — ATORVASTATIN CALCIUM 10 MG PO TABS
10.0000 mg | ORAL_TABLET | Freq: Every day | ORAL | Status: DC
  Administered 2017-05-22: 10 mg via ORAL
  Filled 2017-05-22: qty 1

## 2017-05-22 MED ORDER — ACETAMINOPHEN 650 MG RE SUPP: 650.0000 mg | Freq: Four times a day (QID) | RECTAL | Status: DC | PRN

## 2017-05-22 MED ORDER — MAGNESIUM OXIDE 400 (241.3 MG) MG PO TABS
400.0000 mg | ORAL_TABLET | Freq: Every day | ORAL | Status: DC
  Administered 2017-05-22 – 2017-05-23 (×2): 400 mg via ORAL
  Filled 2017-05-22 (×2): qty 1

## 2017-05-22 MED ORDER — ASPIRIN 81 MG PO CHEW
324.0000 mg | CHEWABLE_TABLET | Freq: Once | ORAL | Status: AC
  Administered 2017-05-22: 324 mg via ORAL
  Filled 2017-05-22: qty 4

## 2017-05-22 MED ORDER — CALCIUM CARBONATE ANTACID 500 MG PO CHEW
1.0000 | CHEWABLE_TABLET | Freq: Three times a day (TID) | ORAL | Status: DC
  Administered 2017-05-22 – 2017-05-23 (×3): 200 mg via ORAL
  Filled 2017-05-22 (×4): qty 1

## 2017-05-22 MED ORDER — DOCUSATE SODIUM 100 MG PO CAPS: 100.0000 mg | ORAL_CAPSULE | Freq: Every day | ORAL | Status: DC | PRN

## 2017-05-22 MED ORDER — VITAMIN D 1000 UNITS PO TABS
1000.0000 [IU] | ORAL_TABLET | Freq: Every day | ORAL | Status: DC
  Administered 2017-05-22 – 2017-05-23 (×2): 1000 [IU] via ORAL
  Filled 2017-05-22 (×2): qty 1

## 2017-05-22 MED ORDER — ASPIRIN EC 81 MG PO TBEC
81.0000 mg | DELAYED_RELEASE_TABLET | Freq: Every day | ORAL | Status: DC
  Administered 2017-05-22 – 2017-05-23 (×2): 81 mg via ORAL
  Filled 2017-05-22 (×2): qty 1

## 2017-05-22 MED ORDER — PANTOPRAZOLE SODIUM 40 MG PO TBEC
40.0000 mg | DELAYED_RELEASE_TABLET | Freq: Every day | ORAL | Status: DC
  Administered 2017-05-22 – 2017-05-23 (×2): 40 mg via ORAL
  Filled 2017-05-22 (×2): qty 1

## 2017-05-22 MED ORDER — SOTALOL HCL 80 MG PO TABS
40.0000 mg | ORAL_TABLET | Freq: Two times a day (BID) | ORAL | Status: DC
  Administered 2017-05-22 – 2017-05-23 (×3): 40 mg via ORAL
  Filled 2017-05-22 (×4): qty 0.5

## 2017-05-22 MED ORDER — APIXABAN 2.5 MG PO TABS
2.5000 mg | ORAL_TABLET | Freq: Two times a day (BID) | ORAL | Status: DC
  Administered 2017-05-22 – 2017-05-23 (×3): 2.5 mg via ORAL
  Filled 2017-05-22 (×3): qty 1

## 2017-05-22 MED ORDER — VITAMIN B-12 100 MCG PO TABS
100.0000 ug | ORAL_TABLET | Freq: Every day | ORAL | Status: DC
  Administered 2017-05-22 – 2017-05-23 (×2): 100 ug via ORAL
  Filled 2017-05-22 (×2): qty 1

## 2017-05-22 NOTE — ED Notes (Signed)
ED Provider at bedside. 

## 2017-05-22 NOTE — ED Provider Notes (Signed)
[facility] Emergency Department Provider Note    First MD Initiated Contact with Patient 05/22/17 [phone]     (approximate)  I have reviewed the triage vital signs and the nursing notes.   HISTORY  Chief Complaint Weakness and Chest Pain    HPI Jamie Spence is a 83 y.o. female with Jamie Spence of chronic medical conditions including atrial fibrillation willing pacemaker presents to emergency department via EMS with central "chest pressure worse with laying flat 3 days. Patient admits to bilateral lower extremity weakness as well since Sunday. Patient also admits to dyspnea. Patient denies any lower extremity swelling. Patient denies any diaphoresis no nausea or vomiting   Past Medical History:  Diagnosis Date  . Cancer ([place])    uterine  . Carotid artery disease ([place])   . Hyperlipemia   . Hypertension   . Osteopenia   . Peripheral vascular disease [facility])     Patient Active Problem List   Diagnosis Date Noted  . AAA (abdominal aortic aneurysm) without rupture ([place]) 05/21/2017  . Arrhythmia 05/20/2017  . Ulcer of ankle ([place]) 04/10/2017  . Atherosclerosis of native arteries of the extremities with ulceration ([place]) 04/10/2017  . GERD (gastroesophageal reflux disease) 04/10/2017  . Hyperlipidemia 04/07/2017  . Enteritis 11/26/2016  . Acquired heart block 01/20/2016  . Carotid stenosis 03/09/2015    Past Surgical History:  Procedure Laterality Date  . PACEMAKER INSERTION Left 01/23/2016   Procedure: INSERTION PACEMAKER;  Surgeon: Isaias Cowman, MD;  Location: [facility] ORS;  Service: Cardiovascular;  Laterality: Left;  . PERIPHERAL VASCULAR CATHETERIZATION Left 03/09/2015   Procedure: Carotid PTA/Stent Intervention;  Surgeon: [name], MD;  Location: [place] CV LAB;  Service: Cardiovascular;  Laterality: Left;  . PERIPHERAL VASCULAR CATHETERIZATION  03/09/2015   Procedure: Carotid Angiography;  Surgeon: [name], MD;  Location:  [place] [facility];  Service: Cardiovascular;;    Prior to Admission medications   Medication Sig Start Date End Date Taking? Authorizing Provider  apixaban (ELIQUIS) 2.5 MG TABS tablet Take 1 tablet (2.5 mg total) by mouth 2 (two) times daily. 01/24/16  Yes Bettey Costa, MD  aspirin EC 81 MG tablet Take by mouth.   Yes [provider]  atorvastatin (LIPITOR) 10 MG tablet Take 10 mg by mouth daily.   Yes [provider]  calcium gluconate 500 MG tablet Take 1 tablet by mouth 3 (three) times daily.   Yes [provider]  cholecalciferol (VITAMIN D) 1000 UNITS tablet Take 1,000 Units by mouth daily.    Yes [provider]  docusate sodium (COLACE) 100 MG capsule Take 100 mg by mouth daily as needed for mild constipation.    Yes [provider]  ferrous sulfate 325 (65 FE) MG tablet Take 325 mg by mouth daily with breakfast.   Yes [provider]  magnesium oxide (MAG-OX) 400 MG tablet Take 400 mg by mouth daily.   Yes [provider]  omeprazole (PRILOSEC) 20 MG capsule Take by mouth.   Yes [provider]  Potassium 99 MG TABS Take by mouth.   Yes [provider]  sotalol (BETAPACE) 80 MG tablet Take 0.5 tablets (40 mg total) by mouth 2 (two) times daily. 01/24/16  Yes Mody, Ulice Bold, MD  vitamin B-12 (CYANOCOBALAMIN) 100 MCG tablet Take 100 mcg by mouth daily.   Yes [provider]  nicotine (NICODERM CQ - DOSED IN MG/24 HOURS) 21 mg/24hr patch Place 1 patch (21 mg total) onto the  skin daily. 01/24/16   Bettey Costa, MD  ondansetron (ZOFRAN) 4 MG tablet Take 1 tablet (4 mg total) by mouth every 6 (six) hours as needed for nausea. Patient not taking: Reported on 05/22/2017 11/28/16   Loletha Grayer, MD  oxyCODONE (OXY IR/ROXICODONE) 5 MG immediate release tablet Take 1 tablet (5 mg total) by mouth every 6 (six) hours as needed for moderate pain. Patient not taking: Reported on 05/22/2017 11/28/16   Loletha Grayer,  MD    Allergies No known drug allergies  Family History  Problem Relation Age of Onset  . Diabetes Neg Hx     Social History Social History  Substance Use Topics  . Smoking status: Current Every Day Smoker    Packs/day: 1.00    Years: 60.00  . Smokeless tobacco: Never Used  . Alcohol use No    Review of Systems Constitutional: No fever/chills Eyes: No visual changes. ENT: No sore throat. Cardiovascular: Positive for chest pressure Respiratory: Positive for shortness of breath. Gastrointestinal: No abdominal pain.  No nausea, no vomiting.  No diarrhea.  No constipation. Genitourinary: Negative for dysuria. Musculoskeletal: Negative for neck pain.  Negative for back pain. Integumentary: Negative for rash. Neurological: Negative for headaches, focal weakness or numbness.   ____________________________________________   PHYSICAL EXAM:  VITAL SIGNS: ED Triage Vitals  Enc Vitals Group     BP 05/22/17 0434 126/60     Pulse Rate 05/22/17 0435 64     Resp 05/22/17 0435 (!) 25     Temp 05/22/17 0435 97.6 F (36.4 C)     Temp Source 05/22/17 0435 Oral     SpO2 05/22/17 0433 97 %     Weight 05/22/17 0440 49.9 kg (110 lb)     Height 05/22/17 0440 1.6 m (5\' 3" )     Head Circumference --      Peak Flow --      Pain Score --      Pain Loc --      Pain Edu? --      Excl. in [place]? --     Constitutional: Alert and oriented. Well appearing and in no acute distress. Eyes: Conjunctivae are normal. Head: Atraumatic. Mouth/Throat: Mucous membranes are moist.  Oropharynx non-erythematous. Neck: No stridor.   Cardiovascular: Normal rate, regular rhythm. Good peripheral circulation. Grossly normal heart sounds. Respiratory: Normal respiratory effort.  No retractions.Bibasilar rhonchi Gastrointestinal: Soft and nontender. No distention.  Musculoskeletal: No lower extremity tenderness nor edema. No gross deformities of extremities. Neurologic:  Normal speech and language. No  gross focal neurologic deficits are appreciated.  Skin:  Skin is warm, dry and intact. No rash noted. Psychiatric: Mood and affect are normal. Speech and behavior are normal.  ____________________________________________   LABS (all labs ordered are listed, but only abnormal results are displayed)  Labs Reviewed  CBC - Abnormal; Notable for the following:       Result Value   RBC 3.78 (*)    MCH 34.7 (*)    All other components within normal limits  BASIC METABOLIC PANEL  TROPONIN I  BRAIN NATRIURETIC PEPTIDE   ____________________________________________  EKG  ED ECG REPORT I, Madelia N BROWN, the attending physician, personally viewed and interpreted this ECG.   Date: 05/22/2017  EKG Time: 4:37 AM  Rate: 66  Rhythm: Normal sinus rhythm  Axis: Normal  Intervals:Normal  ST&T Change: None  ____________________________________________  RADIOLOGY I, [place] N BROWN, personally viewed and evaluated these images (plain radiographs) as part of my medical  decision making, as well as reviewing the written report by the radiologist.  Dg Chest Port 1 View  Result Date: 05/22/2017 CLINICAL DATA:  Left-sided chest pain. EXAM: PORTABLE CHEST 1 VIEW COMPARISON:  Radiograph 11/25/2016 FINDINGS: Left-sided pacemaker in place. Unchanged heart size and mediastinal contours with tortuosity of the thoracic aorta. Retrocardiac hiatal hernia on prior exam not as well demonstrated. Chronic interstitial and bronchial thickening, with increase from prior exam. Possible small right pleural effusion. No confluent airspace disease. No pneumothorax. IMPRESSION: Increased interstitial and bronchial thickening above baseline, suspicious for pulmonary edema. Possible small right pleural effusion. Electronically Signed   By: Jeb Levering M.D.   On: 05/22/2017 05:01      Procedures   ____________________________________________   INITIAL IMPRESSION / ASSESSMENT AND PLAN / ED  COURSE  Pertinent labs & imaging results that were available during my care of the patient were reviewed by me and considered in my medical decision making (see chart for details).  83 year old female presenting with history of physical exam concerning for possible congestive heart failure given orthopnea and chest pressure and dyspnea. Chest x-ray revealed evidence of possible pulmonary edema BNP elevated over 1300. Patient given Lasix 40 mg IV. Patient also given aspirin 324 mg given elevated troponin 0.04 without any EKG changes of ischemia or infarction. Patient discussed with Dr. Hedda Slade admission for further evaluation and management      ____________________________________________  FINAL CLINICAL IMPRESSION(S) / ED DIAGNOSES  Final diagnoses:  Chest pain, unspecified type  Acute pulmonary edema ([place])     MEDICATIONS GIVEN DURING THIS VISIT:  Medications - No data to display   NEW OUTPATIENT MEDICATIONS STARTED DURING THIS VISIT:  New Prescriptions   No medications on file    Modified Medications   No medications on file    Discontinued Medications   CYANOCOBALAMIN 100 MCG LOZG    Take by mouth.   FERROUS SULFATE (FER-IN-SOL) 75 (15 FE) MG/ML SOLN    Take 65 mg by mouth.   HYDROCODONE-ACETAMINOPHEN (NORCO/VICODIN) 5-325 MG TABLET    Take by mouth.     Note:  This document was prepared using Dragon voice recognition software and may include unintentional dictation errors.    Gregor Hams, MD 05/22/17 [phone]

## 2017-05-22 NOTE — Progress Notes (Signed)
Initial Nutrition Assessment  DOCUMENTATION CODES:   Severe malnutrition in context of chronic illness  INTERVENTION:  1. Recommend Ensure Enlive po BID, each supplement provides 350 kcal and 20 grams of protein  2. Recommend MVI w/ Minerals  NUTRITION DIAGNOSIS:   Malnutrition (Severe) related to chronic illness as evidenced by severe depletion of muscle mass, severe depletion of body fat, percent weight loss  GOAL:   Patient will meet greater than or equal to 90% of their needs  MONITOR:   PO intake, I & O's, Labs, Supplement acceptance, Weight trends  REASON FOR ASSESSMENT:   Malnutrition Screening Tool    ASSESSMENT:   [name] is an 83 yo female with PMHHx of Cancer, CAD, HLD, HTN, PVD, Afib, presents with chest pain, nausea, vomiting, generalized weakness, and shortness of breath. Found to be suffering from pulmonary edema.  Spoke with patient, daughter at bedside. She reports poor appetite for 6-7 years since her husband was on chemotherapy and passed away. She now forces herself to eat. Mostly eats out now 2-3 times per day, will drink boost when she isn't hungry. Ate approximately 50% of her meal today, eggs, biscuits and gravy, sausage. Denies any nausea/vomiting or issues chewing/swallowing/choking.  Reports a usual body weight of 112 pounds, with a 3 pound weight loss recently but per chart she exhibits a 14 pound/11.3% severe wt loss over 6 months per chart.  Labs reviewed:  BNP 1306  Medications reviewed and include:  Calcium Carbonate, Vitamin D, Colace, Iron, Mag-Ox, Vitamin B12,   Diet Order:  Diet 2 gram sodium Room service appropriate? Yes; Fluid consistency: Thin  Skin:  Reviewed, no issues  Last BM:  PTA  Height:   Ht Readings from Last 1 Encounters:  05/22/17 5\' 3"  (1.6 m)    Weight:   Wt Readings from Last 1 Encounters:  05/22/17 109 lb 8 oz (49.7 kg)    Ideal Body Weight:  52.27 kg  BMI:  Body mass index is 19.4  kg/m.  Estimated Nutritional Needs:   Kcal:  1200-1300 calories (MSJ x1.3-1.44)  Protein:  74-84 grams (1.5-1.7g/kg)  Fluid:  per MD in setting of fluid overload  EDUCATION NEEDS:   No education needs identified at this time  Satira Anis. Claritza July, MS, RD LDN Inpatient Clinical Dietitian Pager [phone]

## 2017-05-22 NOTE — ED Notes (Signed)
Pt ambulate from bed to toilet in room independently with steady gait with this RN at the side. No issues.

## 2017-05-22 NOTE — ED Triage Notes (Signed)
Pt bib [facility] from home where she lives with granddaughter d/t weakness in bilat extremities that began Sunday. Pt also reports central-left chest pressure that was worse when lying flat. Pt has hx afib and has a pacemaker. deneis CP on arrival

## 2017-05-22 NOTE — H&P (Signed)
Jamie Spence is an 83 y.o. female.   Chief Complaint: Chest pain HPI: The patient with past medical history of atrial fibrillation, biventricular pacemaker placement, hypertension and aortic stenosis presents to the emergency department complaining of chest pain. The patient states that she has had pressure in her chest that radiates into her arms bilaterally for the last 3 days. During this time she is also had difficulty laying flat to sleep at night. Throughout the day she does not have chest pain and has been able to participate in exercise class but admits to fatiguing more quickly than normal. Tonight she decided to come to the emergency department due to nausea and generalized weakness that accompanied her shortness of breath and chest pain. Chest x-ray showed pulmonary edema and BNP was elevated in addition to mild elevation in troponin level which prompted the emergency department staff to call the hospitalist service for admission.  Past Medical History:  Diagnosis Date  . Cancer ([place])    uterine  . Carotid artery disease ([place])   . Hyperlipemia   . Hypertension   . Osteopenia   . Peripheral vascular disease [facility])     Past Surgical History:  Procedure Laterality Date  . PACEMAKER INSERTION Left 01/23/2016   Procedure: INSERTION PACEMAKER;  Surgeon: Isaias Cowman, MD;  Location: [facility] ORS;  Service: Cardiovascular;  Laterality: Left;  . PERIPHERAL VASCULAR CATHETERIZATION Left 03/09/2015   Procedure: Carotid PTA/Stent Intervention;  Surgeon: [name], MD;  Location: [place] [facility];  Service: Cardiovascular;  Laterality: Left;  . PERIPHERAL VASCULAR CATHETERIZATION  03/09/2015   Procedure: Carotid Angiography;  Surgeon: [name], MD;  Location: [place] [facility];  Service: Cardiovascular;;    Family History  Problem Relation Age of Onset  . Diabetes Neg Hx    Social History:  reports that she has been smoking.  She has a 60.00 pack-year smoking  history. She has never used smokeless tobacco. She reports that she does not drink alcohol or use drugs.  Allergies: No Known Allergies  Medications Prior to Admission  Medication Sig Dispense Refill  . apixaban (ELIQUIS) 2.5 MG TABS tablet Take 1 tablet (2.5 mg total) by mouth 2 (two) times daily. 60 tablet 0  . aspirin EC 81 MG tablet Take by mouth.    Marland Kitchen atorvastatin (LIPITOR) 10 MG tablet Take 10 mg by mouth daily.    . calcium gluconate 500 MG tablet Take 1 tablet by mouth 3 (three) times daily.    . cholecalciferol (VITAMIN D) 1000 UNITS tablet Take 1,000 Units by mouth daily.     Marland Kitchen docusate sodium (COLACE) 100 MG capsule Take 100 mg by mouth daily as needed for mild constipation.     . ferrous sulfate 325 (65 FE) MG tablet Take 325 mg by mouth daily with breakfast.    . magnesium oxide (MAG-OX) 400 MG tablet Take 400 mg by mouth daily.    Marland Kitchen omeprazole (PRILOSEC) 20 MG capsule Take by mouth.    . Potassium 99 MG TABS Take by mouth.    . sotalol (BETAPACE) 80 MG tablet Take 0.5 tablets (40 mg total) by mouth 2 (two) times daily. 30 tablet 0  . vitamin B-12 (CYANOCOBALAMIN) 100 MCG tablet Take 100 mcg by mouth daily.    . nicotine (NICODERM CQ - DOSED IN MG/24 HOURS) 21 mg/24hr patch Place 1 patch (21 mg total) onto the skin daily. 28 patch 0  . ondansetron (ZOFRAN) 4 MG tablet Take 1 tablet (4  mg total) by mouth every 6 (six) hours as needed for nausea. (Patient not taking: Reported on 05/22/2017) 20 tablet 0  . oxyCODONE (OXY IR/ROXICODONE) 5 MG immediate release tablet Take 1 tablet (5 mg total) by mouth every 6 (six) hours as needed for moderate pain. (Patient not taking: Reported on 05/22/2017) 10 tablet 0    Results for orders placed or performed during the hospital encounter of 05/22/17 (from the past 48 hour(s))  Basic metabolic panel     Status: Abnormal   Collection Time: 05/22/17  4:34 AM  Result Value Ref Range   Sodium 138 135 - 145 mmol/L   Potassium 4.7 3.5 - 5.1 mmol/L    Chloride 100 (L) 101 - 111 mmol/L   CO2 31 22 - 32 mmol/L   Glucose, Bld 116 (H) 65 - 99 mg/dL   BUN 16 6 - 20 mg/dL   Creatinine, Ser 0.85 0.44 - 1.00 mg/dL   Calcium 9.3 8.9 - 10.3 mg/dL   GFR calc non Af Amer 59 (L) >60 mL/min   GFR calc Af Amer >60 >60 mL/min    Comment: (NOTE) The eGFR has been calculated using the CKD EPI equation. This calculation has not been validated in all clinical situations. eGFR's persistently <60 mL/min signify possible Chronic Kidney Disease.    Anion gap 7 5 - 15  CBC     Status: Abnormal   Collection Time: 05/22/17  4:34 AM  Result Value Ref Range   WBC 8.6 3.6 - 11.0 K/uL   RBC 3.78 (L) 3.80 - 5.20 MIL/uL   Hemoglobin 13.1 12.0 - 16.0 g/dL   HCT 37.5 35.0 - 47.0 %   MCV 99.3 80.0 - 100.0 fL   MCH 34.7 (H) 26.0 - 34.0 pg   MCHC 34.9 32.0 - 36.0 g/dL   RDW 14.5 11.5 - 14.5 %   Platelets 199 150 - 440 K/uL  Troponin I     Status: Abnormal   Collection Time: 05/22/17  4:34 AM  Result Value Ref Range   Troponin I 0.05 (HH) <0.03 ng/mL    Comment: CRITICAL RESULT CALLED TO, READ BACK BY AND VERIFIED WITH ALLISON PATE @ 0509 ON 05/22/2017 BY [facility]   Brain natriuretic peptide     Status: Abnormal   Collection Time: 05/22/17  4:34 AM  Result Value Ref Range   B Natriuretic Peptide 1,306.0 (H) 0.0 - 100.0 pg/mL   Dg Chest Port 1 View  Result Date: 05/22/2017 CLINICAL DATA:  Left-sided chest pain. EXAM: PORTABLE CHEST 1 VIEW COMPARISON:  Radiograph 11/25/2016 FINDINGS: Left-sided pacemaker in place. Unchanged heart size and mediastinal contours with tortuosity of the thoracic aorta. Retrocardiac hiatal hernia on prior exam not as well demonstrated. Chronic interstitial and bronchial thickening, with increase from prior exam. Possible small right pleural effusion. No confluent airspace disease. No pneumothorax. IMPRESSION: Increased interstitial and bronchial thickening above baseline, suspicious for pulmonary edema. Possible small right pleural effusion.  Electronically Signed   By: Jeb Levering M.D.   On: 05/22/2017 05:01    Review of Systems  Constitutional: Negative for chills and fever.  HENT: Negative for sore throat and tinnitus.   Eyes: Negative for blurred vision and redness.  Respiratory: Positive for shortness of breath. Negative for cough.   Cardiovascular: Positive for chest pain and orthopnea. Negative for palpitations and PND.  Gastrointestinal: Negative for abdominal pain, diarrhea, nausea and vomiting.  Genitourinary: Negative for dysuria, frequency and urgency.  Musculoskeletal: Negative for joint pain and myalgias.  Skin: Negative for rash.       No lesions  Neurological: Negative for speech change, focal weakness and weakness.  Endo/Heme/Allergies: Does not bruise/bleed easily.       No temperature intolerance  Psychiatric/Behavioral: Negative for depression and suicidal ideas.    Blood pressure (!) 125/56, pulse 63, temperature 98.6 F (37 C), temperature source Oral, resp. rate 16, height _0  (1.6 m), weight 49.7 kg (109 lb 8 oz), SpO2 98 %. Physical Exam  Vitals reviewed. Constitutional: She is oriented to person, place, and time. She appears well-developed and well-nourished. No distress.  HENT:  Head: Normocephalic and atraumatic.  Mouth/Throat: Oropharynx is clear and moist.  Eyes: Pupils are equal, round, and reactive to light. Conjunctivae and EOM are normal. No scleral icterus.  Neck: Normal range of motion. Neck supple. No JVD present. No tracheal deviation present. No thyromegaly present.  Cardiovascular: Normal rate and regular rhythm.  Exam reveals no gallop and no friction rub.   Murmur heard.  Systolic murmur is present with a grade of 3/6  Respiratory: Effort normal and breath sounds normal.  GI: Soft. Bowel sounds are normal. She exhibits no distension. There is no tenderness.  Genitourinary:  Genitourinary Comments: Deferred  Musculoskeletal: Normal range of motion. She exhibits no edema.   Lymphadenopathy:    She has no cervical adenopathy.  Neurological: She is alert and oriented to person, place, and time. No cranial nerve deficit. She exhibits normal muscle tone.  Skin: Skin is warm and dry. No rash noted. No erythema.  Psychiatric: She has a normal mood and affect. Her behavior is normal. Judgment and thought content normal.     Assessment/Plan This is a 83 year old female admitted for chest pain. 1. Chest pain: Atypical; pressure likely secondary to vascular congestion and pulmonary edema. Continue to cycle troponins and monitor telemetry. Consult cardiology. The patient has no arrhythmias on telemetry at this time. Continue aspirin 2. Pulmonary edema: The patient had an echocardiogram 5 days ago which shows preserved ejection fraction of approximately 55%. Severe aortic stenosis is confirmed but there is no mention of diastolic dysfunction. Await cardiology input. 3. Hypertension: Controlled 4. Tobacco abuse: NicoDerm patch 5. Atrial fibrillation: Paroxysmal; continue Eliquis. Sotalol for rate control 6. Hyperlipidemia: Continue statin therapy 7. GI prophylaxis: Pantoprazole per home regimen 8. DVT prophylaxis: Anticoagulation as above The patient is a full code. Time spent on admission orders and patient care approximately 45 minutes  Harrie Foreman, MD 05/22/2017, 6:48 AM

## 2017-05-22 NOTE — Care Management Obs Status (Signed)
[facility] NOTIFICATION   Patient Details  Name: Jamie Spence MRN: [id number] Date of Birth: November 11, 1925   Medicare Observation Status Notification Given:  Yes Notice signed, one given to patient and the other to HIM for scanning   Katrina Stack, RN 05/22/2017, 12:55 PM

## 2017-05-22 NOTE — Consult Note (Signed)
[facility]  CARDIOLOGY CONSULT NOTE  Patient ID: Jamie Spence MRN: [id number] DOB/AGE: [birth year] 83 y.o.  Admit date: 05/22/2017 Referring Physician Marcille Blanco Primary Physician Bronstein Primary Cardiologist Paraschos Reason for Consultation Chest pain  HPI: 83 year old female referred for evaluation of chest pain. The patient has a history of severe aortic stenosis, paroxysmal atrial fibrillation on Eliquis, COPD with ongoing tobacco abuse, status post dual-chamber pacemaker for sinus pauses 01/23/2016, carotid stenosis status post left carotid stent 02/2015, and essential hypertension. The patient reports a three-day history of experiencing chest tightness without pain only occurring within lying flat at night. The discomfort was associated with mild nausea and some shortness of breath, without diaphoresis or lightheadedness. The patient describes the tightness as a shifting sensation; to whatever side the chest tightness shifted, she experienced arm pain on that side. The first 2 nights, the discomfort was relieved with sitting upright and taking Aleve. Last night, however, the pain was not relieved with these measures, so she called EMS. The patient is typically very active for her age, participating in hour-long exercise sessions with [employer], which she did yesterday morning. She did notice mildly worsened exertional shortness of breath and easy fatigability yesterday at the gym, but she did not experience exertional chest pain, presyncope, or syncope. She has had mild bilateral ankle edema. Admission labs notable for initial troponin 0.05, BNP 1,306. ECG revealed atrial sensed ventricular paced rhythm at a rate of 66 bpm. Chest x-ray revealed possible small right pleural effusion and findings suggestive of pulmonary edema. The patient denies recurrence of chest pain, as she states she has been lying at an incline. She denies shortness of breath or palpitations. 2-D echocardiogram on  05/17/2017 revealed normal left ventricular function with LVEF 55%, with severe aortic stenosis, with mean gradient 49.4 mmHg, and peak gradient 80.8 mmHg. ETT sestamibi study for preoperative evaluation prior to left carotid stenting 01/2015 revealed mild apical wall ischemia. Cardiac catheterization was deferred in the absence of chest pain.   Review of systems complete and found to be negative unless listed above     Past Medical History:  Diagnosis Date  . Cancer ([place])    uterine  . Carotid artery disease ([place])   . Hyperlipemia   . Hypertension   . Osteopenia   . Peripheral vascular disease [facility])     Past Surgical History:  Procedure Laterality Date  . PACEMAKER INSERTION Left 01/23/2016   Procedure: INSERTION PACEMAKER;  Surgeon: Isaias Cowman, MD;  Location: [facility] ORS;  Service: Cardiovascular;  Laterality: Left;  . PERIPHERAL VASCULAR CATHETERIZATION Left 03/09/2015   Procedure: Carotid PTA/Stent Intervention;  Surgeon: [name], MD;  Location: [place] CV LAB;  Service: Cardiovascular;  Laterality: Left;  . PERIPHERAL VASCULAR CATHETERIZATION  03/09/2015   Procedure: Carotid Angiography;  Surgeon: [name], MD;  Location: [place] CV LAB;  Service: Cardiovascular;;    Prescriptions Prior to Admission  Medication Sig Dispense Refill Last Dose  . apixaban (ELIQUIS) 2.5 MG TABS tablet Take 1 tablet (2.5 mg total) by mouth 2 (two) times daily. 60 tablet 0 05/21/2017 at Unknown time  . aspirin EC 81 MG tablet Take by mouth.   05/21/2017 at Unknown time  . atorvastatin (LIPITOR) 10 MG tablet Take 10 mg by mouth daily.   05/21/2017 at Unknown time  . calcium gluconate 500 MG tablet Take 1 tablet by mouth 3 (three) times daily.   05/21/2017 at Unknown time  . cholecalciferol (VITAMIN D) 1000 UNITS tablet Take  1,000 Units by mouth daily.    05/21/2017 at Unknown time  . docusate sodium (COLACE) 100 MG capsule Take 100 mg by mouth daily as needed for mild  constipation.    05/21/2017 at Unknown time  . ferrous sulfate 325 (65 FE) MG tablet Take 325 mg by mouth daily with breakfast.   05/21/2017 at Unknown time  . magnesium oxide (MAG-OX) 400 MG tablet Take 400 mg by mouth daily.   05/21/2017 at Unknown time  . omeprazole (PRILOSEC) 20 MG capsule Take by mouth.   05/21/2017 at Unknown time  . Potassium 99 MG TABS Take by mouth.   05/21/2017 at Unknown time  . sotalol (BETAPACE) 80 MG tablet Take 0.5 tablets (40 mg total) by mouth 2 (two) times daily. 30 tablet 0 05/21/2017 at Unknown time  . vitamin B-12 (CYANOCOBALAMIN) 100 MCG tablet Take 100 mcg by mouth daily.   05/21/2017 at Unknown time  . nicotine (NICODERM CQ - DOSED IN MG/24 HOURS) 21 mg/24hr patch Place 1 patch (21 mg total) onto the skin daily. 28 patch 0 Taking  . ondansetron (ZOFRAN) 4 MG tablet Take 1 tablet (4 mg total) by mouth every 6 (six) hours as needed for nausea. (Patient not taking: Reported on 05/22/2017) 20 tablet 0 Not Taking at Unknown time  . oxyCODONE (OXY IR/ROXICODONE) 5 MG immediate release tablet Take 1 tablet (5 mg total) by mouth every 6 (six) hours as needed for moderate pain. (Patient not taking: Reported on 05/22/2017) 10 tablet 0 Not Taking at Unknown time   Social History   Social History  . Marital status: Widowed    Spouse name: N/A  . Number of children: N/A  . Years of education: N/A   Occupational History  . retired    Social History Main Topics  . Smoking status: Current Every Day Smoker    Packs/day: 1.00    Years: 60.00  . Smokeless tobacco: Never Used  . Alcohol use No  . Drug use: No  . Sexual activity: Not on file   Other Topics Concern  . Not on file   Social History Narrative  . No narrative on file    Family History  Problem Relation Age of Onset  . Diabetes Neg Hx       Review of systems complete and found to be negative unless listed above      PHYSICAL EXAM  General: Well developed, well nourished, in no acute distress,  resting comfortably in bed at a 45 incline HEENT:  Normocephalic and atramatic Neck:  No JVD.  Lungs: Decreased breath sounds on the right, right basilar crackles, normal effort of breathing on room air Heart: HRRR . 3/6 murmur heard best at RUSB Abdomen: Bowel sounds are positive, abdomen soft and non-tender  Msk:  Able to sit upright in bed, gait not assessedt. Normal strength and tone for age. Extremities: No clubbing, cyanosis or edema.   Neuro: Alert and oriented X 3. Psych:  Good affect, responds appropriately  Labs:   Lab Results  Component Value Date   WBC 8.6 05/22/2017   HGB 13.1 05/22/2017   HCT 37.5 05/22/2017   MCV 99.3 05/22/2017   PLT 199 05/22/2017    Recent Labs Lab 05/22/17 0434  NA 138  K 4.7  CL 100*  CO2 31  BUN 16  CREATININE 0.85  CALCIUM 9.3  GLUCOSE 116*   Lab Results  Component Value Date   TROPONINI 0.05 ([place]) 05/22/2017   No results found  for: CHOL No results found for: HDL No results found for: LDLCALC No results found for: TRIG No results found for: CHOLHDL No results found for: LDLDIRECT    Radiology: Dg Chest Port 1 View  Result Date: 05/22/2017 CLINICAL DATA:  Left-sided chest pain. EXAM: PORTABLE CHEST 1 VIEW COMPARISON:  Radiograph 11/25/2016 FINDINGS: Left-sided pacemaker in place. Unchanged heart size and mediastinal contours with tortuosity of the thoracic aorta. Retrocardiac hiatal hernia on prior exam not as well demonstrated. Chronic interstitial and bronchial thickening, with increase from prior exam. Possible small right pleural effusion. No confluent airspace disease. No pneumothorax. IMPRESSION: Increased interstitial and bronchial thickening above baseline, suspicious for pulmonary edema. Possible small right pleural effusion. Electronically Signed   By: Jeb Levering M.D.   On: 05/22/2017 05:01    EKG: Atrial sensed ventricular paced rhythm  ASSESSMENT AND PLAN:  1. Atypical chest pain, less suspicious for ACS as it  only occurs when lying flat, relieved when being upright, without exertional symptoms, with initial troponin 0.05. Recent echocardiogram negative for pericardial effusion.  2. Severe aortic stenosis with mean gradient 49.4 mmHg, possibly a good candidate for TAVR; patient interested and will be discussed in 1 week as outpatient. 3. Pulmonary edema 4. Atrial fibrillation, rate controlled, on Eliquis for stroke prevention 5. COPD with ongoing tobacco abuse 6. Status post dual-chamber pacemaker  Recommendations: 1. Continue current therapy. 2. Continue IV Lasix with careful monitoring of renal status 3. Continue Eliquis for stroke prevention 4. Continue to cycle cardiac enzymes 5. Defer further cardiac diagnostics at this time 6. Plan to discuss TAVR further as outpatient in 1 week.  Signed: Clabe Seal, PA-C 05/22/2017, 8:09 AM

## 2017-05-22 NOTE — ED Notes (Signed)
Admitting MD Diamond at bedside at this time.  

## 2017-05-23 ENCOUNTER — Observation Stay: Payer: Medicare Other

## 2017-05-23 DIAGNOSIS — J810 Acute pulmonary edema: Secondary | ICD-10-CM | POA: Diagnosis not present

## 2017-05-23 NOTE — Progress Notes (Signed)
[place] [facility] [place] NAME: Jamie Spence    MR#:  [id number]  DATE OF BIRTH:  [birth year]  SUBJECTIVE:  CHIEF COMPLAINT:   Chief Complaint  Patient presents with  . Weakness  . Chest Pain     Came with chest pain, had some pulm edema. On IV lasix- better. REVIEW OF SYSTEMS:  CONSTITUTIONAL: No fever, fatigue or weakness.  EYES: No blurred or double vision.  EARS, NOSE, AND THROAT: No tinnitus or ear pain.  RESPIRATORY: No cough, shortness of breath, wheezing or hemoptysis.  CARDIOVASCULAR: No chest pain, orthopnea, edema.  GASTROINTESTINAL: No nausea, vomiting, diarrhea or abdominal pain.  GENITOURINARY: No dysuria, hematuria.  ENDOCRINE: No polyuria, nocturia,  HEMATOLOGY: No anemia, easy bruising or bleeding SKIN: No rash or lesion. MUSCULOSKELETAL: No joint pain or arthritis.   NEUROLOGIC: No tingling, numbness, weakness.  PSYCHIATRY: No anxiety or depression.   ROS  DRUG ALLERGIES:  No Known Allergies  VITALS:  Blood pressure (!) 102/38, pulse 65, temperature (!) 97.5 F (36.4 C), resp. rate 18, height 5\' 3"  (1.6 m), weight 49.7 kg (109 lb 8 oz), SpO2 93 %.  PHYSICAL EXAMINATION:  GENERAL:  83 y.o.-year-old patient lying in the bed with no acute distress.  EYES: Pupils equal, round, reactive to light and accommodation. No scleral icterus. Extraocular muscles intact.  HEENT: Head atraumatic, normocephalic. Oropharynx and nasopharynx clear.  NECK:  Supple, no jugular venous distention. No thyroid enlargement, no tenderness.  LUNGS: Normal breath sounds bilaterally, no wheezing, rales,rhonchi or crepitation. No use of accessory muscles of respiration.  CARDIOVASCULAR: S1, S2 normal. No murmurs, rubs, or gallops.  ABDOMEN: Soft, nontender, nondistended. Bowel sounds present. No organomegaly or mass.  EXTREMITIES: No pedal edema, cyanosis, or clubbing.  NEUROLOGIC: Cranial nerves II through XII are intact. Muscle strength 5/5 in  all extremities. Sensation intact. Gait not checked.  PSYCHIATRIC: The patient is alert and oriented x 3.  SKIN: No obvious rash, lesion, or ulcer.   Physical Exam LABORATORY PANEL:   CBC  Recent Labs Lab 05/22/17 0434  WBC 8.6  HGB 13.1  HCT 37.5  PLT 199   ------------------------------------------------------------------------------------------------------------------  Chemistries   Recent Labs Lab 05/22/17 0434  NA 138  K 4.7  CL 100*  CO2 31  GLUCOSE 116*  BUN 16  CREATININE 0.85  CALCIUM 9.3   ------------------------------------------------------------------------------------------------------------------  Cardiac Enzymes  Recent Labs Lab 05/22/17 1614 05/22/17 2233  TROPONINI 0.08* 0.07*   ------------------------------------------------------------------------------------------------------------------  RADIOLOGY:  Dg Chest 2 View  Result Date: 05/23/2017 CLINICAL DATA:  CHF. EXAM: CHEST  2 VIEW COMPARISON:  05/22/2017. FINDINGS: Cardiac pacer with lead tips in right atrium right ventricle. Cardiomegaly with normal pulmonary vascularity. Mild bilateral interstitial prominence noted, slightly improved from prior exam suggesting partial clearing of pulmonary interstitial edema. Small right pleural effusion . IMPRESSION: Cardiac pacer stable position. Cardiomegaly with mild bilateral from interstitial prominence, improved slightly from prior exam suggesting improving CHF. Small right pleural effusion . Electronically Signed   By: Marcello Moores  Register   On: 05/23/2017 07:41   Dg Chest Port 1 View  Result Date: 05/22/2017 CLINICAL DATA:  Left-sided chest pain. EXAM: PORTABLE CHEST 1 VIEW COMPARISON:  Radiograph 11/25/2016 FINDINGS: Left-sided pacemaker in place. Unchanged heart size and mediastinal contours with tortuosity of the thoracic aorta. Retrocardiac hiatal hernia on prior exam not as well demonstrated. Chronic interstitial and bronchial thickening, with  increase from prior exam. Possible small right pleural effusion. No confluent airspace disease. No pneumothorax.  IMPRESSION: Increased interstitial and bronchial thickening above baseline, suspicious for pulmonary edema. Possible small right pleural effusion. Electronically Signed   By: Jeb Levering M.D.   On: 05/22/2017 05:01    ASSESSMENT AND PLAN:   Active Problems:   Chest pain  1. Chest pain: Atypical; pressure likely secondary to vascular congestion and pulmonary edema. Continue to cycle troponins and monitor telemetry.    Appreciated Consult cardiology. The patient has no arrhythmias on telemetry at this time. Continue aspirin  troponins negative, cardio suggest to do work up as out pt. 2. Pulmonary edema:    Ac diastolic CHF The patient had an echocardiogram 5 days ago which shows preserved ejection fraction of approximately 55%. Severe aortic stenosis  Responded well to IV lasix. 3. Hypertension: Controlled 4. Tobacco abuse: NicoDerm patch 5. Atrial fibrillation: Paroxysmal; continue Eliquis. Sotalol for rate control 6. Hyperlipidemia: Continue statin therapy 7. GI prophylaxis: Pantoprazole per home regimen 8. DVT prophylaxis: Anticoagulation as above   All the records are reviewed and case discussed with Care Management/Social Workerr. Management plans discussed with the patient, family and they are in agreement.  CODE STATUS: full.  TOTAL TIME TAKING CARE OF THIS PATIENT: 35 minutes.     POSSIBLE D/C IN 1-2 DAYS, DEPENDING ON CLINICAL CONDITION.   Vaughan Basta M.D on 05/23/2017   Between 7am to 6pm - Pager - [phone]  After 6pm go to [email or web address] - password [email or web address] [facility]  Office  [phone]  CC: Primary care physician; Juluis Pitch, MD  Note: This dictation was prepared with Dragon dictation along with smaller phrase technology. Any transcriptional errors that result from this process are unintentional.

## 2017-05-23 NOTE — Progress Notes (Signed)
Camillia Herter to be D/C'd Home per MD order. No Rx given.medication list explained in detail. Pt verbalized understanding.  Allergies as of 05/23/2017   No Known Allergies     Medication List    STOP taking these medications   Potassium 99 MG Tabs     TAKE these medications   apixaban 2.5 MG Tabs tablet Commonly known as:  ELIQUIS Take 1 tablet (2.5 mg total) by mouth 2 (two) times daily.   aspirin EC 81 MG tablet Take by mouth.   atorvastatin 10 MG tablet Commonly known as:  LIPITOR Take 10 mg by mouth daily.   calcium gluconate 500 MG tablet Take 1 tablet by mouth 3 (three) times daily.   cholecalciferol 1000 units tablet Commonly known as:  VITAMIN D Take 1,000 Units by mouth daily.   docusate sodium 100 MG capsule Commonly known as:  COLACE Take 100 mg by mouth daily as needed for mild constipation.   ferrous sulfate 325 (65 FE) MG tablet Take 325 mg by mouth daily with breakfast.   magnesium oxide 400 MG tablet Commonly known as:  MAG-OX Take 400 mg by mouth daily.   nicotine 21 mg/24hr patch Commonly known as:  NICODERM CQ - dosed in mg/24 hours Place 1 patch (21 mg total) onto the skin daily.   omeprazole 20 MG capsule Commonly known as:  PRILOSEC Take by mouth.   ondansetron 4 MG tablet Commonly known as:  ZOFRAN Take 1 tablet (4 mg total) by mouth every 6 (six) hours as needed for nausea.   oxyCODONE 5 MG immediate release tablet Commonly known as:  Oxy IR/ROXICODONE Take 1 tablet (5 mg total) by mouth every 6 (six) hours as needed for moderate pain.   sotalol 80 MG tablet Commonly known as:  BETAPACE Take 0.5 tablets (40 mg total) by mouth 2 (two) times daily.   vitamin B-12 100 MCG tablet Commonly known as:  CYANOCOBALAMIN Take 100 mcg by mouth daily.            Discharge Care Instructions        Start     Ordered   05/23/17 0000  Increase activity slowly     05/23/17 1443   05/23/17 0000  Diet - low sodium heart healthy     05/23/17 1443      Vitals:   05/23/17 0909 05/23/17 1154  BP: 93/72 (!) 102/38  Pulse: 64 65  Resp: 18 18  Temp: 98 F (36.7 C) (!) 97.5 F (36.4 C)  SpO2: 94% 93%    Skin clean, dry and intact without evidence of skin break down, no evidence of skin tears noted. IV catheter discontinued intact. Site without signs and symptoms of complications. Dressing and pressure applied. Pt denies pain at this time. No complaints noted.  An After Visit Summary was printed and given to the patient.Patient escorted via [place], and D/C home via private auto with adaughter.  Rolley Sims

## 2017-05-23 NOTE — Progress Notes (Signed)
[facility]  SUBJECTIVE: Patient reports feeling back to baseline this morning. She denies recurrence of chest pain or shortness of breath. She has ambulated to the bathroom without difficulty. She denies lightheadedness or presyncope.   Vitals:   05/22/17 1424 05/22/17 1926 05/23/17 0431 05/23/17 0433  BP: (!) 90/43 (!) 124/92 (!) 89/44 (!) 99/49  Pulse: 62 72 65 65  Resp: 20 18 18    Temp: 97.8 F (36.6 C) 97.7 F (36.5 C) 98.2 F (36.8 C)   TempSrc: Oral Oral Oral   SpO2: 94% 97% 90%   Weight:      Height:         Intake/Output Summary (Last 24 hours) at 05/23/17 1601 Last data filed at 05/23/17 0932  Gross per 24 hour  Intake              240 ml  Output             2050 ml  Net            -1810 ml      PHYSICAL EXAM  General: Well developed, well nourished, in no acute distress HEENT:  Normocephalic and atramatic Neck:  No JVD.  Lungs: Normal effort of breathing on room air. Course rhonchi on right Heart: HRRR . HRRR . 3/6 murmur heard best at RUSB Abdomen: Bowel sounds are positive, abdomen soft and non-tender  Msk:  Able to sit upright in bed without difficulty. Normal strength and tone for age. Extremities: No clubbing, cyanosis or edema.   Neuro: Alert and oriented X 3. Psych:  Good affect, responds appropriately   LABS: Basic Metabolic Panel:  Recent Labs  05/22/17 0434  NA 138  K 4.7  CL 100*  CO2 31  GLUCOSE 116*  BUN 16  CREATININE 0.85  CALCIUM 9.3   Liver Function Tests: No results for input(s): AST, ALT, ALKPHOS, BILITOT, PROT, ALBUMIN in the last 72 hours. No results for input(s): LIPASE, AMYLASE in the last 72 hours. CBC:  Recent Labs  05/22/17 0434  WBC 8.6  HGB 13.1  HCT 37.5  MCV 99.3  PLT 199   Cardiac Enzymes:  Recent Labs  05/22/17 1126 05/22/17 1614 05/22/17 2233  TROPONINI 0.07* 0.08* 0.07*   BNP: Invalid input(s): POCBNP D-Dimer: No results for input(s): DDIMER in the last 72 hours. Hemoglobin  A1C:  Recent Labs  05/22/17 1126  HGBA1C 5.1   Fasting Lipid Panel: No results for input(s): CHOL, HDL, LDLCALC, TRIG, CHOLHDL, LDLDIRECT in the last 72 hours. Thyroid Function Tests:  Recent Labs  05/22/17 1126  TSH 1.276   Anemia Panel: No results for input(s): VITAMINB12, FOLATE, FERRITIN, TIBC, IRON, RETICCTPCT in the last 72 hours.  Dg Chest 2 View  Result Date: 05/23/2017 CLINICAL DATA:  CHF. EXAM: CHEST  2 VIEW COMPARISON:  05/22/2017. FINDINGS: Cardiac pacer with lead tips in right atrium right ventricle. Cardiomegaly with normal pulmonary vascularity. Mild bilateral interstitial prominence noted, slightly improved from prior exam suggesting partial clearing of pulmonary interstitial edema. Small right pleural effusion . IMPRESSION: Cardiac pacer stable position. Cardiomegaly with mild bilateral from interstitial prominence, improved slightly from prior exam suggesting improving CHF. Small right pleural effusion . Electronically Signed   By: Marcello Moores  Register   On: 05/23/2017 07:41   Dg Chest Port 1 View  Result Date: 05/22/2017 CLINICAL DATA:  Left-sided chest pain. EXAM: PORTABLE CHEST 1 VIEW COMPARISON:  Radiograph 11/25/2016 FINDINGS: Left-sided pacemaker in place. Unchanged heart size and mediastinal contours with tortuosity  of the thoracic aorta. Retrocardiac hiatal hernia on prior exam not as well demonstrated. Chronic interstitial and bronchial thickening, with increase from prior exam. Possible small right pleural effusion. No confluent airspace disease. No pneumothorax. IMPRESSION: Increased interstitial and bronchial thickening above baseline, suspicious for pulmonary edema. Possible small right pleural effusion. Electronically Signed   By: Jeb Levering M.D.   On: 05/22/2017 05:01     Echo LVEF 55%, severe aortic stenosis  TELEMETRY: AS-VP rhythm, 66 bpm  ASSESSMENT AND PLAN:  Active Problems:   Chest pain   1. Atypical chest pain, less suspicious for ACS  as it only occurs when lying flat, relieved when being upright, without exertional symptoms. Troponin 0.05, 0.07, 0.08, followed by 0.07. Patient denies recurrence of chest pain or shortness of breath, states she feels back to baseline.  2. Severe aortic stenosis with mean gradient 49.4 mmHg, possibly a good candidate for TAVR; patient interested and will be discussed in 1 week as outpatient. 3. Pulmonary edema, denies shortness of breath or recurrent chest pain, receiving IV Lasix BID. Chest xray shows evidence of improvement 4. Atrial fibrillation, rate controlled, on Eliquis for stroke prevention 5. COPD with ongoing tobacco abuse 6. Status post dual-chamber pacemaker  Recommendations: 1. Continue current therapy 2. Continue IV Lasix with careful monitoring of renal status and blood pressure 3. Continue Eliquis for stroke prevention 4. Encourage patient to stop smoking 5. Recommend follow-up as outpatient with Dr. Saralyn Pilar in 1 week, and to further discuss TAVR  Clabe Seal, PA-C 05/23/2017 7:52 AM

## 2017-05-23 NOTE — Progress Notes (Signed)
Spoke with dr. Anselm Jungling regarding bp 93/72 and scheduled dose iv lasix 20 mg this am. Per md hold am dose of lasix

## 2017-05-23 NOTE — Discharge Summary (Signed)
[facility] [place] NAME: Jamie Spence    MR#:  [id number]  DATE OF BIRTH:  December 25, 1925  DATE OF ADMISSION:  05/22/2017 ADMITTING PHYSICIAN: Harrie Foreman, MD  DATE OF DISCHARGE: 05/23/2017  PRIMARY CARE PHYSICIAN: Juluis Pitch, MD    ADMISSION DIAGNOSIS:  Acute pulmonary edema ([place]) [J81.0] Chest pain, unspecified type [R07.9]  DISCHARGE DIAGNOSIS:  Active Problems:   Chest pain   SECONDARY DIAGNOSIS:   Past Medical History:  Diagnosis Date  . Cancer ([place])    uterine  . Carotid artery disease ([place])   . Hyperlipemia   . Hypertension   . Osteopenia   . Peripheral vascular disease ([place])     HOSPITAL COURSE:   1. Chest pain: Atypical; pressure likely secondary to vascular congestion and pulmonary edema. Continue to cycle troponins and monitor telemetry.    Appreciated Consult cardiology. The patient has no arrhythmias on telemetry at this time. Continue aspirin  troponins negative, cardio suggest to do work up as out pt. 2. Pulmonary edema:    Ac diastolic CHF The patient had an echocardiogram 5 days ago which shows preserved ejection fraction of approximately 55%. Severe aortic stenosis  Responded well to IV lasix. 3. Hypertension: Controlled 4. Tobacco abuse: NicoDerm patch 5. Atrial fibrillation: Paroxysmal; continue Eliquis. Sotalol for rate control 6. Hyperlipidemia: Continue statin therapy 7. GI prophylaxis: Pantoprazole per home regimen 8. DVT prophylaxis: Anticoagulation as above DISCHARGE CONDITIONS:   Stable.  CONSULTS OBTAINED:  Treatment Team:  Isaias Cowman, MD  DRUG ALLERGIES:  No Known Allergies  DISCHARGE MEDICATIONS:   Current Discharge Medication List    CONTINUE these medications which have NOT CHANGED   Details  apixaban (ELIQUIS) 2.5 MG TABS tablet Take 1 tablet (2.5 mg total) by mouth 2 (two) times daily. Qty: 60 tablet, Refills: 0    aspirin EC 81 MG tablet Take  by mouth.    atorvastatin (LIPITOR) 10 MG tablet Take 10 mg by mouth daily.    calcium gluconate 500 MG tablet Take 1 tablet by mouth 3 (three) times daily.    cholecalciferol (VITAMIN D) 1000 UNITS tablet Take 1,000 Units by mouth daily.     docusate sodium (COLACE) 100 MG capsule Take 100 mg by mouth daily as needed for mild constipation.     ferrous sulfate 325 (65 FE) MG tablet Take 325 mg by mouth daily with breakfast.    magnesium oxide (MAG-OX) 400 MG tablet Take 400 mg by mouth daily.    omeprazole (PRILOSEC) 20 MG capsule Take by mouth.    sotalol (BETAPACE) 80 MG tablet Take 0.5 tablets (40 mg total) by mouth 2 (two) times daily. Qty: 30 tablet, Refills: 0    vitamin B-12 (CYANOCOBALAMIN) 100 MCG tablet Take 100 mcg by mouth daily.    nicotine (NICODERM CQ - DOSED IN MG/24 HOURS) 21 mg/24hr patch Place 1 patch (21 mg total) onto the skin daily. Qty: 28 patch, Refills: 0    ondansetron (ZOFRAN) 4 MG tablet Take 1 tablet (4 mg total) by mouth every 6 (six) hours as needed for nausea. Qty: 20 tablet, Refills: 0    oxyCODONE (OXY IR/ROXICODONE) 5 MG immediate release tablet Take 1 tablet (5 mg total) by mouth every 6 (six) hours as needed for moderate pain. Qty: 10 tablet, Refills: 0      STOP taking these medications     Potassium 99 MG TABS          DISCHARGE  INSTRUCTIONS:    Follow with Cardio clinic in 1-2 weeks/  If you experience worsening of your admission symptoms, develop shortness of breath, life threatening emergency, suicidal or homicidal thoughts you must seek medical attention immediately by calling 911 or calling your MD immediately  if symptoms less severe.  You Must read complete instructions/literature along with all the possible adverse reactions/side effects for all the Medicines you take and that have been prescribed to you. Take any new Medicines after you have completely understood and accept all the possible adverse reactions/side effects.    Please note  You were cared for by a hospitalist during your hospital stay. If you have any questions about your discharge medications or the care you received while you were in the hospital after you are discharged, you can call the unit and asked to speak with the hospitalist on call if the hospitalist that took care of you is not available. Once you are discharged, your primary care physician will handle any further medical issues. Please note that NO REFILLS for any discharge medications will be authorized once you are discharged, as it is imperative that you return to your primary care physician (or establish a relationship with a primary care physician if you do not have one) for your aftercare needs so that they can reassess your need for medications and monitor your lab values.    Today   CHIEF COMPLAINT:   Chief Complaint  Patient presents with  . Weakness  . Chest Pain    HISTORY OF PRESENT ILLNESS:  Jamie Spence  is a 83 y.o. female with a known history of atrial fibrillation, biventricular pacemaker placement, hypertension and aortic stenosis presents to the emergency department complaining of chest pain. The patient states that she has had pressure in her chest that radiates into her arms bilaterally for the last 3 days. During this time she is also had difficulty laying flat to sleep at night. Throughout the day she does not have chest pain and has been able to participate in exercise class but admits to fatiguing more quickly than normal. Tonight she decided to come to the emergency department due to nausea and generalized weakness that accompanied her shortness of breath and chest pain. Chest x-ray showed pulmonary edema and BNP was elevated in addition to mild elevation in troponin level which prompted the emergency department staff to call the hospitalist service for admission.   VITAL SIGNS:  Blood pressure (!) 102/38, pulse 65, temperature (!) 97.5 F (36.4 C), resp.  rate 18, height 5\' 3"  (1.6 m), weight 49.7 kg (109 lb 8 oz), SpO2 93 %.  I/O:   Intake/Output Summary (Last 24 hours) at 05/23/17 1444 Last data filed at 05/23/17 1200  Gross per 24 hour  Intake              360 ml  Output              950 ml  Net             -590 ml    PHYSICAL EXAMINATION:  GENERAL:  83 y.o.-year-old patient lying in the bed with no acute distress.  EYES: Pupils equal, round, reactive to light and accommodation. No scleral icterus. Extraocular muscles intact.  HEENT: Head atraumatic, normocephalic. Oropharynx and nasopharynx clear.  NECK:  Supple, no jugular venous distention. No thyroid enlargement, no tenderness.  LUNGS: Normal breath sounds bilaterally, no wheezing, rales,rhonchi or crepitation. No use of accessory muscles of respiration.  CARDIOVASCULAR: S1,  S2 normal. No murmurs, rubs, or gallops.  ABDOMEN: Soft, non-tender, non-distended. Bowel sounds present. No organomegaly or mass.  EXTREMITIES: No pedal edema, cyanosis, or clubbing.  NEUROLOGIC: Cranial nerves II through XII are intact. Muscle strength 5/5 in all extremities. Sensation intact. Gait not checked.  PSYCHIATRIC: The patient is alert and oriented x 3.  SKIN: No obvious rash, lesion, or ulcer.   DATA REVIEW:   CBC  Recent Labs Lab 05/22/17 0434  WBC 8.6  HGB 13.1  HCT 37.5  PLT 199    Chemistries   Recent Labs Lab 05/22/17 0434  NA 138  K 4.7  CL 100*  CO2 31  GLUCOSE 116*  BUN 16  CREATININE 0.85  CALCIUM 9.3    Cardiac Enzymes  Recent Labs Lab 05/22/17 2233  TROPONINI 0.07*    Microbiology Results  Results for orders placed or performed during the hospital encounter of 11/26/16  Blood Culture (routine x 2)     Status: None   Collection Time: 11/26/16  3:09 AM  Result Value Ref Range Status   Specimen Description BLOOD LEFT ANTECUBITAL  Final   Special Requests   Final    BOTTLES DRAWN AEROBIC AND ANAEROBIC BCHV HIGH VOLUME   Culture NO GROWTH 5 DAYS  Final    Report Status 12/01/2016 FINAL  Final  Blood Culture (routine x 2)     Status: None   Collection Time: 11/26/16  3:09 AM  Result Value Ref Range Status   Specimen Description BLOOD RIGHT WRIST  Final   Special Requests   Final    BOTTLES DRAWN AEROBIC AND ANAEROBIC BCAV ADEQUATE VOLUME   Culture NO GROWTH 5 DAYS  Final   Report Status 12/01/2016 FINAL  Final  Gastrointestinal Panel by PCR , Stool     Status: Abnormal   Collection Time: 11/26/16  7:19 AM  Result Value Ref Range Status   Campylobacter species NOT DETECTED NOT DETECTED Final   Plesimonas shigelloides NOT DETECTED NOT DETECTED Final   Salmonella species NOT DETECTED NOT DETECTED Final   Yersinia enterocolitica NOT DETECTED NOT DETECTED Final   Vibrio species NOT DETECTED NOT DETECTED Final   Vibrio cholerae NOT DETECTED NOT DETECTED Final   Enteroaggregative E coli (EAEC) NOT DETECTED NOT DETECTED Final   Enteropathogenic E coli (EPEC) NOT DETECTED NOT DETECTED Final   Enterotoxigenic E coli (ETEC) NOT DETECTED NOT DETECTED Final   Shiga like toxin producing E coli (STEC) NOT DETECTED NOT DETECTED Final   Shigella/Enteroinvasive E coli (EIEC) NOT DETECTED NOT DETECTED Final   Cryptosporidium NOT DETECTED NOT DETECTED Final   Cyclospora cayetanensis NOT DETECTED NOT DETECTED Final   Entamoeba histolytica NOT DETECTED NOT DETECTED Final   Giardia lamblia NOT DETECTED NOT DETECTED Final   Adenovirus F40/41 NOT DETECTED NOT DETECTED Final   Astrovirus NOT DETECTED NOT DETECTED Final   Norovirus GI/GII NOT DETECTED NOT DETECTED Final   Rotavirus A NOT DETECTED NOT DETECTED Final   Sapovirus (I, II, IV, and V) DETECTED (A) NOT DETECTED Final  C difficile quick scan w PCR reflex     Status: None   Collection Time: 11/26/16  7:19 AM  Result Value Ref Range Status   C Diff antigen NEGATIVE NEGATIVE Final   C Diff toxin NEGATIVE NEGATIVE Final   C Diff interpretation No C. difficile detected.  Final    RADIOLOGY:  Dg  Chest 2 View  Result Date: 05/23/2017 CLINICAL DATA:  CHF. EXAM: CHEST  2 VIEW COMPARISON:  05/22/2017. FINDINGS: Cardiac pacer with lead tips in right atrium right ventricle. Cardiomegaly with normal pulmonary vascularity. Mild bilateral interstitial prominence noted, slightly improved from prior exam suggesting partial clearing of pulmonary interstitial edema. Small right pleural effusion . IMPRESSION: Cardiac pacer stable position. Cardiomegaly with mild bilateral from interstitial prominence, improved slightly from prior exam suggesting improving CHF. Small right pleural effusion . Electronically Signed   By: Marcello Moores  Register   On: 05/23/2017 07:41   Dg Chest Port 1 View  Result Date: 05/22/2017 CLINICAL DATA:  Left-sided chest pain. EXAM: PORTABLE CHEST 1 VIEW COMPARISON:  Radiograph 11/25/2016 FINDINGS: Left-sided pacemaker in place. Unchanged heart size and mediastinal contours with tortuosity of the thoracic aorta. Retrocardiac hiatal hernia on prior exam not as well demonstrated. Chronic interstitial and bronchial thickening, with increase from prior exam. Possible small right pleural effusion. No confluent airspace disease. No pneumothorax. IMPRESSION: Increased interstitial and bronchial thickening above baseline, suspicious for pulmonary edema. Possible small right pleural effusion. Electronically Signed   By: Jeb Levering M.D.   On: 05/22/2017 05:01    EKG:   Orders placed or performed during the hospital encounter of 05/22/17  . ED EKG  . ED EKG  . EKG 12-Lead  . EKG 12-Lead      Management plans discussed with the patient, family and they are in agreement.  CODE STATUS:     Code Status Orders        Start     Ordered   05/22/17 0641  Full code  Continuous     05/22/17 0640    Code Status History    Date Active Date Inactive Code Status Order ID Comments User Context   11/26/2016  6:01 AM 11/28/2016  3:39 PM Full Code [id number]  Saundra Shelling, MD Inpatient    01/20/2016  3:12 PM 01/24/2016  5:13 PM Full Code [id number]  Nicholes Mango, MD Inpatient   03/09/2015 12:11 PM 03/10/2015  4:18 PM Full Code [id number]  Algernon Huxley, MD Inpatient    Advance Directive Documentation     Most Recent Value  Type of Advance Directive  Healthcare Power of Attorney  Pre-existing out of facility DNR order (yellow form or pink MOST form)  -  "MOST" Form in Place?  -      TOTAL TIME TAKING CARE OF THIS PATIENT: 35 minutes.    Vaughan Basta M.D on 05/23/2017 at 2:44 PM  Between 7am to 6pm - Pager - [phone]  After 6pm go to [email or web address] - password [email or web address] [facility]  Office  [phone]  CC: Primary care physician; Juluis Pitch, MD   Note: This dictation was prepared with Dragon dictation along with smaller phrase technology. Any transcriptional errors that result from this process are unintentional.

## 2017-05-23 NOTE — Progress Notes (Signed)
Patient complained of cramping in bilateral lower extremities. Given muscle relaxer. No relief. MD notified. New order placed. Attempted to give patient medication but sleeping. No further complaints.

## 2017-06-09 ENCOUNTER — Encounter: Payer: Self-pay | Admitting: Emergency Medicine

## 2017-06-09 ENCOUNTER — Emergency Department
Admission: EM | Admit: 2017-06-09 | Discharge: 2017-06-09 | Disposition: A | Discharge status: Home / Self Care | Payer: Medicare Other | Attending: Emergency Medicine | Admitting: Emergency Medicine

## 2017-06-09 ENCOUNTER — Emergency Department: Payer: Medicare Other

## 2017-06-09 DIAGNOSIS — I10 Essential (primary) hypertension: Secondary | ICD-10-CM | POA: Insufficient documentation

## 2017-06-09 DIAGNOSIS — Z950 Presence of cardiac pacemaker: Secondary | ICD-10-CM | POA: Diagnosis not present

## 2017-06-09 DIAGNOSIS — F17200 Nicotine dependence, unspecified, uncomplicated: Secondary | ICD-10-CM | POA: Diagnosis not present

## 2017-06-09 DIAGNOSIS — R079 Chest pain, unspecified: Secondary | ICD-10-CM | POA: Diagnosis not present

## 2017-06-09 DIAGNOSIS — I2510 Atherosclerotic heart disease of native coronary artery without angina pectoris: Secondary | ICD-10-CM | POA: Diagnosis not present

## 2017-06-09 DIAGNOSIS — E785 Hyperlipidemia, unspecified: Secondary | ICD-10-CM | POA: Insufficient documentation

## 2017-06-09 DIAGNOSIS — Z79899 Other long term (current) drug therapy: Secondary | ICD-10-CM | POA: Diagnosis not present

## 2017-06-09 DIAGNOSIS — Z7982 Long term (current) use of aspirin: Secondary | ICD-10-CM | POA: Diagnosis not present

## 2017-06-09 LAB — BASIC METABOLIC PANEL
ANION GAP: 8 (ref 5–15)
BUN: 17 mg/dL (ref 6–20)
CHLORIDE: 102 mmol/L (ref 101–111)
CO2: 28 mmol/L (ref 22–32)
CREATININE: 1.01 mg/dL — AB (ref 0.44–1.00)
Calcium: 9.2 mg/dL (ref 8.9–10.3)
GFR calc Af Amer: 55 mL/min — ABNORMAL LOW (ref >60)
GFR calc non Af Amer: 48 mL/min — ABNORMAL LOW (ref >60)
Glucose, Bld: 109 mg/dL — ABNORMAL HIGH (ref 65–99)
POTASSIUM: 4.5 mmol/L (ref 3.5–5.1)
SODIUM: 138 mmol/L (ref 135–145)

## 2017-06-09 LAB — CBC
HCT: 38.3 % (ref 35.0–47.0)
Hemoglobin: 13.3 g/dL (ref 12.0–16.0)
MCH: 35.1 pg — ABNORMAL HIGH (ref 26.0–34.0)
MCHC: 34.7 g/dL (ref 32.0–36.0)
MCV: 101.2 fL — AB (ref 80.0–100.0)
PLATELETS: 208 10*3/uL (ref 150–440)
RBC: 3.79 MIL/uL — ABNORMAL LOW (ref 3.80–5.20)
RDW: 14.5 % (ref 11.5–14.5)
WBC: 6.8 10*3/uL (ref 3.6–11.0)

## 2017-06-09 LAB — TROPONIN I

## 2017-06-09 NOTE — ED Triage Notes (Signed)
Pt arrived POV from home co CP on left side pressure pain going into left arm and back.  Pt had pacemaker placed in 2017.  Pt moaning in pain during triage and answering questions appropriately.

## 2017-06-09 NOTE — Discharge Instructions (Signed)
You have been seen in the emergency department today for chest pain. Your workup has shown normal results. As we discussed please follow-up with your primary care physician in the next 1-2 days for recheck. Return to the emergency department for any further chest pain, trouble breathing, or any other symptom personally concerning to yourself. °

## 2017-06-09 NOTE — ED Notes (Signed)
Report to felicia, rn.  

## 2017-06-09 NOTE — ED Provider Notes (Signed)
[facility] Emergency Department Provider Note  Time seen: 7:46 AM  I have reviewed the triage vital signs and the nursing notes.   HISTORY  Chief Complaint Chest Pain    HPI Jamie Spence is a 83 y.o. female With a past medical history of hypertension, hyperlipidemia, aortic stenosis, presents to the emergency department for chest pain. According to the patient for the past 2-3 weeks she has been experiencing chest pain at night anytime she lies flat. She states the chest pain is alleviated when she sits upright. Patient states last night she was having more constant pain. Tried sitting upright but continued to have chest discomfort so ultimately came to the emergency department. Patient states her chest pain started around 1:00 last night but she was able to go back to sleep but it was still present this morning when she awoke so she came to the emergency department. Patient states mild shortness of breath but denies nausea or diaphoresis. States since arriving to the emergency department she has been sitting upright in her symptoms have completely resolved at this time. Denies any leg pain or swelling. Patient was admitted to the hospital approximate 2 weeks ago for the same chest pain ultimately had a largely normal workup besides an echocardiogram consistent with severe aortic stenosis and the patient was referred to [facility] for consideration of valve replacement. Patient is currently awaiting to hear from [facility] for this appointment.  Past Medical History:  Diagnosis Date  . Cancer ([place])    uterine  . Carotid artery disease ([place])   . Hyperlipemia   . Hypertension   . Osteopenia   . Peripheral vascular disease [facility])     Patient Active Problem List   Diagnosis Date Noted  . Chest pain 05/22/2017  . AAA (abdominal aortic aneurysm) without rupture ([place]) 05/21/2017  . Arrhythmia 05/20/2017  . Ulcer of ankle ([place]) 04/10/2017  . Atherosclerosis of native  arteries of the extremities with ulceration ([place]) 04/10/2017  . GERD (gastroesophageal reflux disease) 04/10/2017  . Hyperlipidemia 04/07/2017  . Enteritis 11/26/2016  . Acquired heart block 01/20/2016  . Carotid stenosis 03/09/2015    Past Surgical History:  Procedure Laterality Date  . PACEMAKER INSERTION Left 01/23/2016   Procedure: INSERTION PACEMAKER;  Surgeon: Isaias Cowman, MD;  Location: [facility] ORS;  Service: Cardiovascular;  Laterality: Left;  . PERIPHERAL VASCULAR CATHETERIZATION Left 03/09/2015   Procedure: Carotid PTA/Stent Intervention;  Surgeon: [name], MD;  Location: [facility] CV LAB;  Service: Cardiovascular;  Laterality: Left;  . PERIPHERAL VASCULAR CATHETERIZATION  03/09/2015   Procedure: Carotid Angiography;  Surgeon: [name], MD;  Location: [facility] CV LAB;  Service: Cardiovascular;;    Prior to Admission medications   Medication Sig Start Date End Date Taking? Authorizing Provider  apixaban (ELIQUIS) 2.5 MG TABS tablet Take 1 tablet (2.5 mg total) by mouth 2 (two) times daily. 01/24/16   Bettey Costa, MD  aspirin EC 81 MG tablet Take by mouth.    [provider]  atorvastatin (LIPITOR) 10 MG tablet Take 10 mg by mouth daily.    [provider]  calcium gluconate 500 MG tablet Take 1 tablet by mouth 3 (three) times daily.    [provider]  cholecalciferol (VITAMIN D) 1000 UNITS tablet Take 1,000 Units by mouth daily.     [provider]  docusate sodium (COLACE) 100 MG capsule Take 100 mg by mouth daily as needed for mild constipation.     [provider]  ferrous sulfate 325 (65 FE) MG tablet Take 325 mg by mouth daily with breakfast.    [provider]  magnesium oxide (MAG-OX) 400 MG tablet Take 400 mg by mouth daily.    [provider]  nicotine (NICODERM CQ - DOSED IN MG/24 HOURS) 21 mg/24hr patch Place 1 patch (21 mg total) onto the skin daily. 01/24/16   Bettey Costa, MD   omeprazole (PRILOSEC) 20 MG capsule Take by mouth.    [provider]  ondansetron (ZOFRAN) 4 MG tablet Take 1 tablet (4 mg total) by mouth every 6 (six) hours as needed for nausea. Patient not taking: Reported on 05/22/2017 11/28/16   Loletha Grayer, MD  oxyCODONE (OXY IR/ROXICODONE) 5 MG immediate release tablet Take 1 tablet (5 mg total) by mouth every 6 (six) hours as needed for moderate pain. Patient not taking: Reported on 05/22/2017 11/28/16   Loletha Grayer, MD  sotalol (BETAPACE) 80 MG tablet Take 0.5 tablets (40 mg total) by mouth 2 (two) times daily. 01/24/16   Bettey Costa, MD  vitamin B-12 (CYANOCOBALAMIN) 100 MCG tablet Take 100 mcg by mouth daily.    [provider]    No Known Allergies  Family History  Problem Relation Age of Onset  . Diabetes Neg Hx     Social History Social History  Substance Use Topics  . Smoking status: Current Every Day Smoker    Packs/day: 1.00    Years: 60.00  . Smokeless tobacco: Never Used  . Alcohol use No    Review of Systems Constitutional: Negative for fever. Cardiovascular: fairly frequent intermittent chest pain mostly at night, worse when lying flat resolves with sitting upright per patient. Respiratory: mild shortness of breath when lying flat. Gastrointestinal: Negative for abdominal pain Musculoskeletal: Negative for leg pain or swelling Neurological: Negative for headache All other ROS negative  ____________________________________________   PHYSICAL EXAM:  VITAL SIGNS: ED Triage Vitals  Enc Vitals Group     BP 06/09/17 0730 116/81     Pulse Rate 06/09/17 0730 61     Resp 06/09/17 0730 18     Temp --      Temp src --      SpO2 06/09/17 0730 97 %     Weight 06/09/17 0626 109 lb (49.4 kg)     Height 06/09/17 0626 5\' 3"  (1.6 m)     Head Circumference --      Peak Flow --      Pain Score 06/09/17 0625 7     Pain Loc --      Pain Edu? --      Excl. in [place]? --     Constitutional: Alert and  oriented. Well appearing and in no distress. Eyes: Normal exam ENT   Head: Normocephalic and atraumatic   Mouth/Throat: Mucous membranes are moist. Cardiovascular: Normal rate, regular rhythm. 3/6 systolic murmur. Respiratory: Normal respiratory effort without tachypnea nor retractions. Breath sounds are clear  Gastrointestinal: Soft and nontender. No distention.  Musculoskeletal: Nontender with normal range of motion in all extremities. no swelling or tenderness of the lower extremities. Neurologic:  Normal speech and language. No gross focal neurologic deficits Skin:  Skin is warm, dry and intact.  Psychiatric: Mood and affect are normal.   ____________________________________________    EKG  EKG shows an atrial sensed ventricular paced rhythm at 64 bpm, widened QRS, largely normal intervals, no concerning findings.  ____________________________________________    RADIOLOGY   IMPRESSION: Fibrotic changes in the  lungs appear stable. No acute abnormality.  ____________________________________________   INITIAL IMPRESSION / ASSESSMENT AND PLAN / ED COURSE  Pertinent labs & imaging results that were available during my care of the patient were reviewed by me and considered in my medical decision making (see chart for details).  patient presents to the emergency department for chest pain with mild shortness of breath when lying flat resolves when sitting upright. Patient was recently admitted to the hospital for similar discomfort or swelling thought to be due to pulmonary edema, was tried on Lasix but could not tolerate due to hypotension, and was taken back off of Lasix. Patient was referred to [facility] for evaluation for possible valve repair/replacement, currently awaiting this appointment. Currently the patient denies any symptoms, denies any chest pain, shortness of breath nausea or diaphoresis. Patient is sitting in bed, no distress. At this time differential would include  ACS, pneumonia, pulmonary edema, CHF, significant bowel dysfunction. We will check labs including cardiac enzymes, chest x-ray and EKG.  EKG shows a paced rhythm with no concerning findings. Labs are largely within normal limits with a negative troponin.chest x-ray shows no acute findings. No pulmonary edema noted.  patient's labs are within normal limits including a negative troponin. Chest x-ray is normal. EKG shows no acute abnormality. Patient has been symptom-free since arriving to the emergency department. I discussed with the patient and is not clear exactly what is causing her discomfort. I believe she should still follow-up with [facility] to discuss her aortic stenosis. Given her recurrent chest pain over the past 2 weeks it is reassuring that her troponin is negative. I discussed with her the options as far as coming back into the hospital for admission versus going home and following up with her cardiologist. Patient would much prefer to follow up with cardiology go home as she was just recently admitted and discharged.I discussed return precautions with the patient.  ____________________________________________   FINAL CLINICAL IMPRESSION(S) / ED DIAGNOSES  chest pain    Harvest Dark, MD 06/09/17 [phone]

## 2017-06-09 NOTE — ED Notes (Signed)
Pt states left sided chest pain around pacemaker insertion site for "weeks". Pt states pain radiates to right chest and makes her right arm spasm when "it is really bad". Pt states pain is better when she sits up. Pt states she is not able to lie down due to pain.

## 2017-09-10 DEATH — deceased

## 2018-03-26 IMAGING — CR DG CHEST 2V
2 series · 2 of 2 positions shown · non-contrast
Comparison: 05/23/2017

CLINICAL DATA: Pt c/o chest pain x few weeks. States was here a few
weeks ago and had fluid on her lungs. Meds were given but pain still
there. Hx of low blood pressure and pacemaker.

EXAM:
CHEST  2 VIEW

[chest pa]
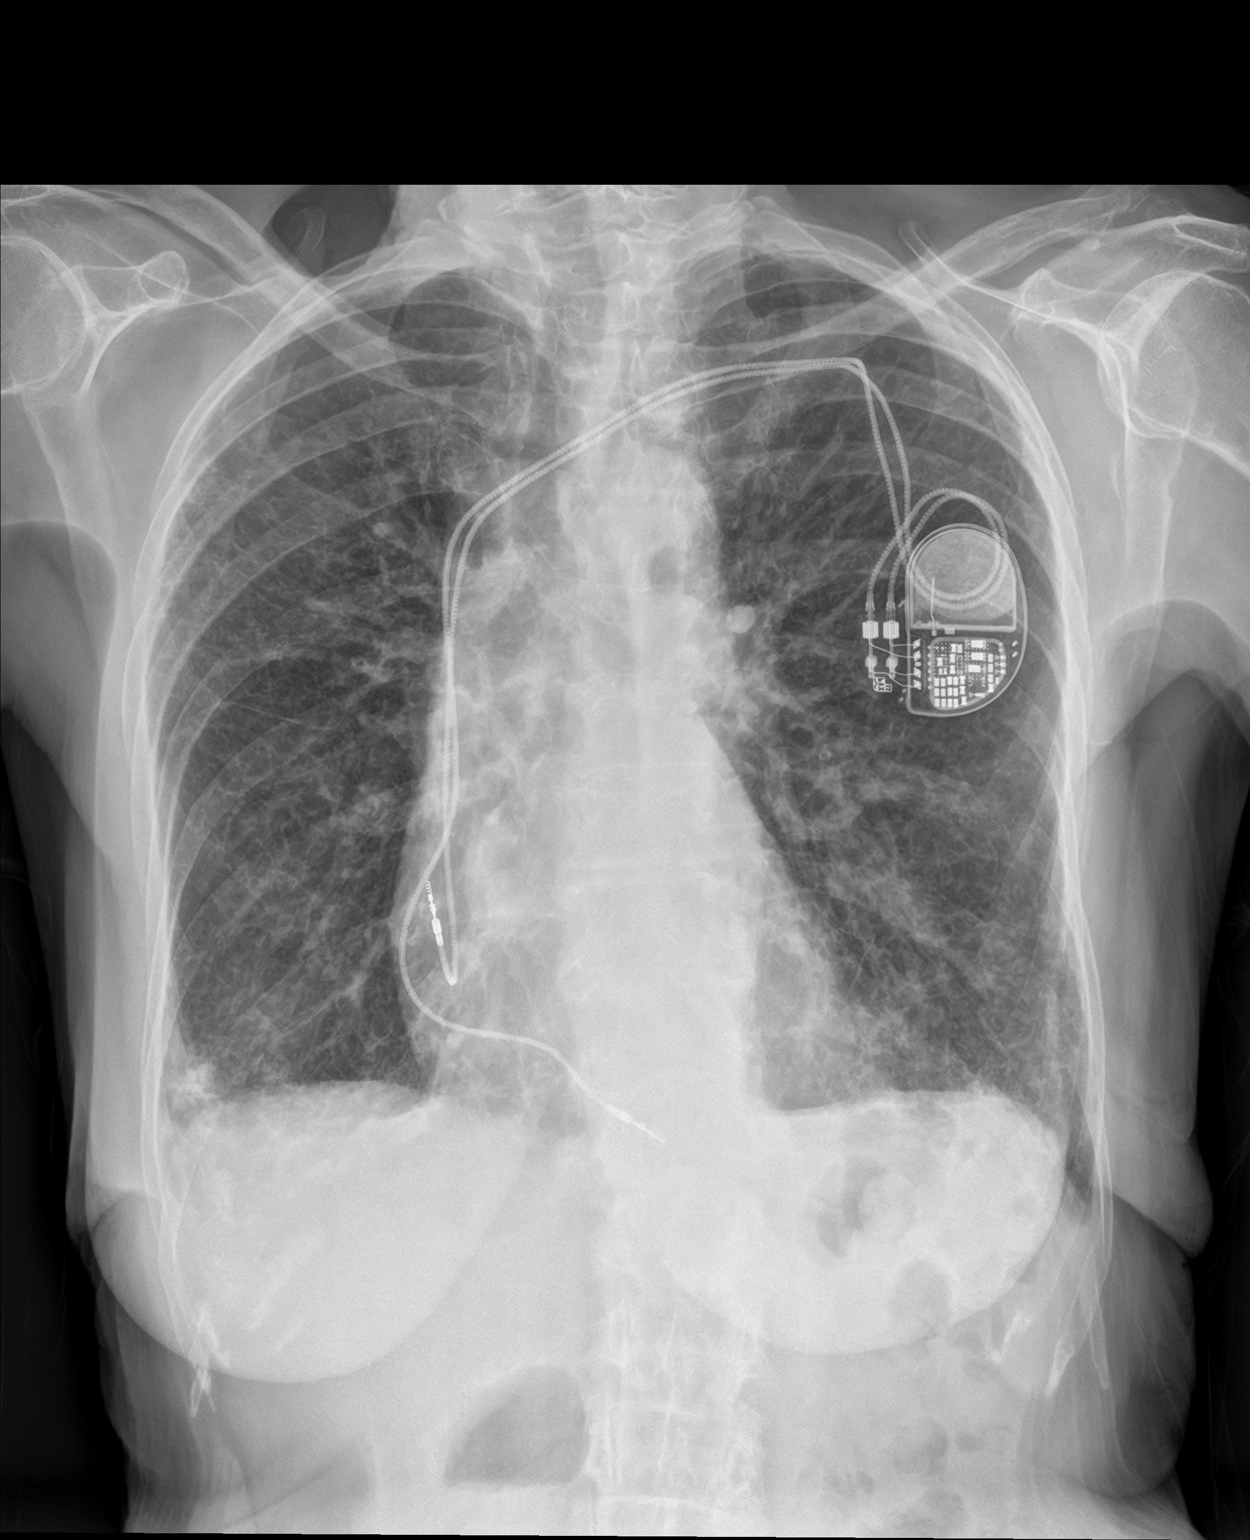

[chest lat]
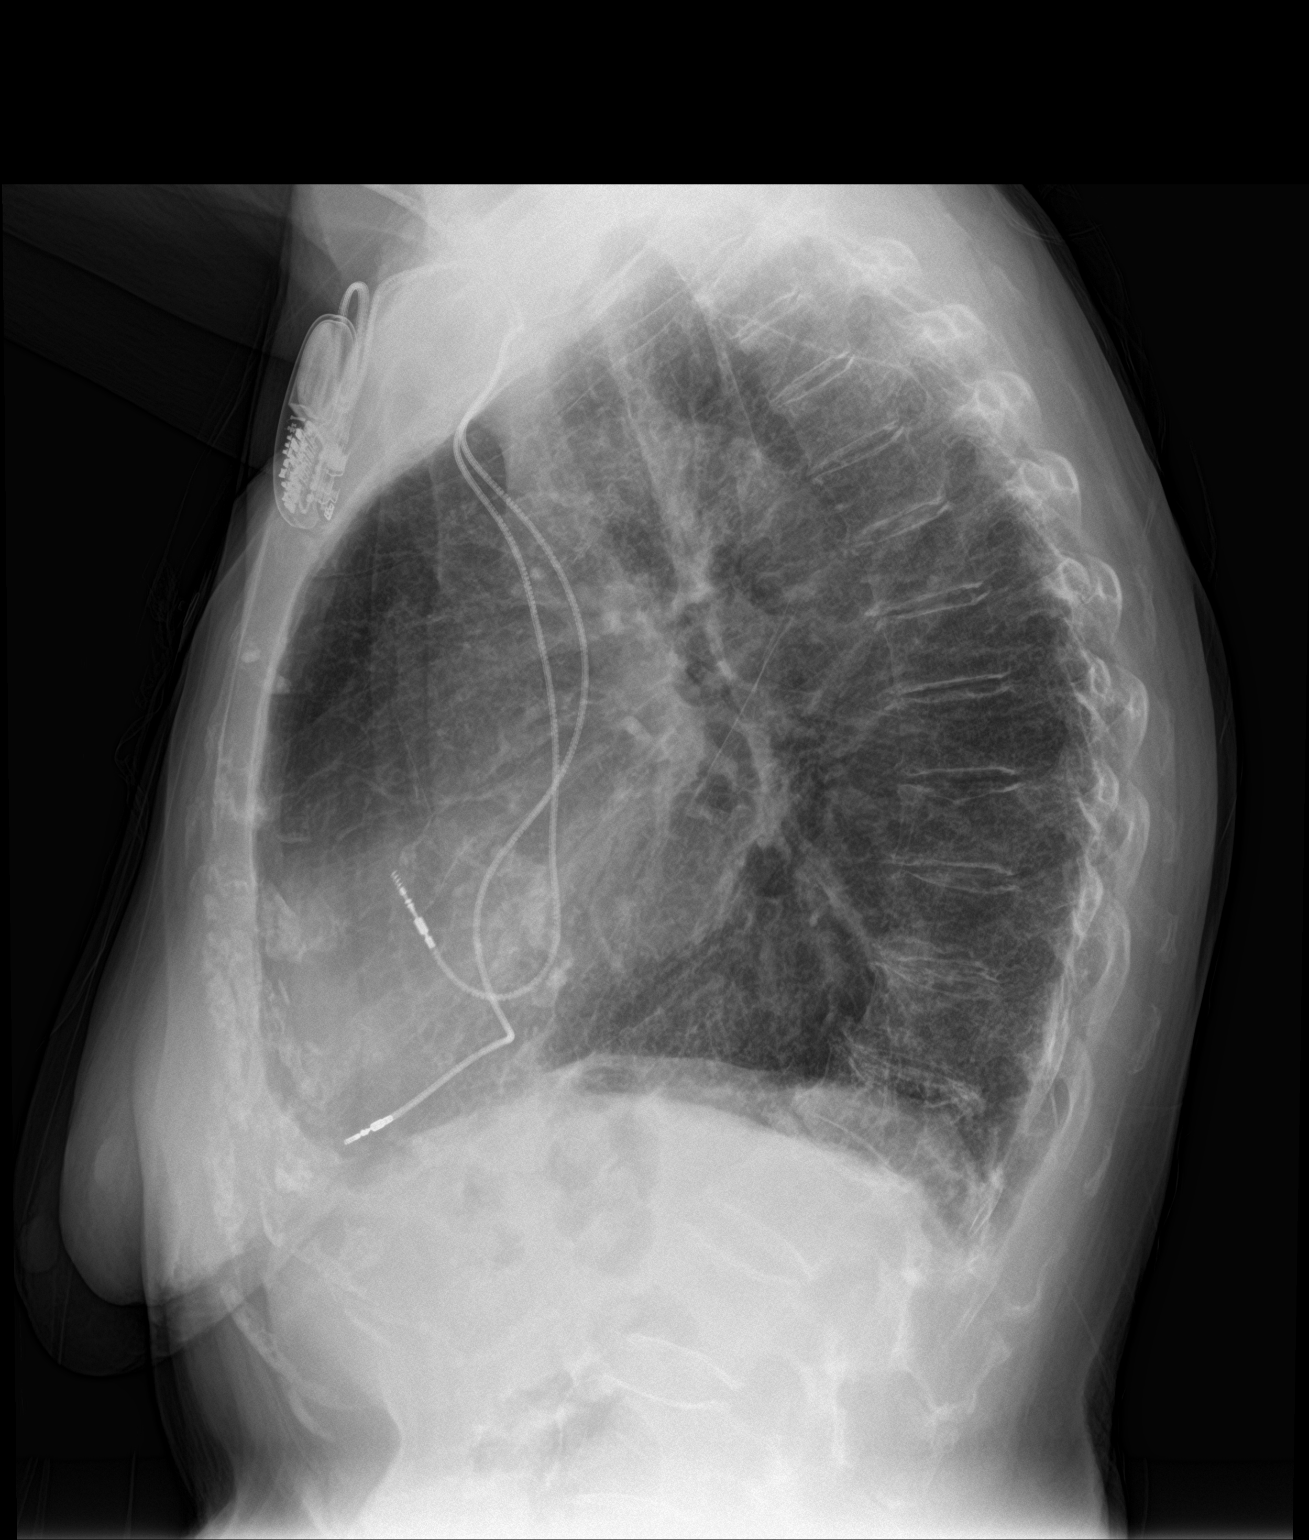

[2 of 2 positions shown; findings below may reference images not displayed]

FINDINGS: Patient's left-sided transvenous pacemaker with leads to the right
atrium and right ventricle. Heart size is normal. There is stable
interstitial prominence throughout the lungs. More focal opacity at
the right lateral lung base likely represent scarring or atelectasis
and appear stable. Of there are no new consolidations or pleural
effusions. There is a chronic wedge compression fracture of T12.
Chronic mild compression of other thoracic levels.
IMPRESSION: Fibrotic changes in the lungs appear stable.  No acute abnormality.

## 2018-05-22 ENCOUNTER — Encounter (INDEPENDENT_AMBULATORY_CARE_PROVIDER_SITE_OTHER): Payer: Self-pay

## 2018-05-22 ENCOUNTER — Ambulatory Visit (INDEPENDENT_AMBULATORY_CARE_PROVIDER_SITE_OTHER): Payer: Medicare Other | Admitting: Vascular Surgery

## 2018-05-22 ENCOUNTER — Encounter (INDEPENDENT_AMBULATORY_CARE_PROVIDER_SITE_OTHER): Payer: Medicare Other
